# Patient Record
Sex: Male | Born: 1935 | Race: White | Hispanic: No | Marital: Married | Smoking: Former smoker
Health system: Southern US, Community
[De-identification: ages and names within clinical notes are randomized; demographics above are authoritative.]

## PROBLEM LIST (undated history)

## (undated) DIAGNOSIS — C801 Malignant (primary) neoplasm, unspecified: Secondary | ICD-10-CM

## (undated) DIAGNOSIS — F32A Depression, unspecified: Secondary | ICD-10-CM

## (undated) DIAGNOSIS — H269 Unspecified cataract: Secondary | ICD-10-CM

## (undated) DIAGNOSIS — F419 Anxiety disorder, unspecified: Secondary | ICD-10-CM

## (undated) HISTORY — PX: TONSILLECTOMY: SUR1361

## (undated) HISTORY — DX: Unspecified cataract: H26.9

## (undated) HISTORY — PX: OTHER SURGICAL HISTORY: SHX169

## (undated) HISTORY — DX: Malignant (primary) neoplasm, unspecified: C80.1

## (undated) HISTORY — DX: Anxiety disorder, unspecified: F41.9

## (undated) HISTORY — DX: Depression, unspecified: F32.A

---

## 2004-09-06 ENCOUNTER — Ambulatory Visit: Payer: Self-pay | Admitting: Urology

## 2004-12-14 ENCOUNTER — Emergency Department: Payer: Self-pay | Admitting: Emergency Medicine

## 2004-12-26 ENCOUNTER — Ambulatory Visit: Payer: Self-pay | Admitting: Urology

## 2006-09-18 ENCOUNTER — Ambulatory Visit: Payer: Self-pay | Admitting: Ophthalmology

## 2007-10-16 ENCOUNTER — Ambulatory Visit: Payer: Self-pay | Admitting: Internal Medicine

## 2007-10-25 ENCOUNTER — Ambulatory Visit: Payer: Self-pay | Admitting: Urology

## 2007-11-03 ENCOUNTER — Ambulatory Visit: Payer: Self-pay | Admitting: Urology

## 2007-11-03 ENCOUNTER — Other Ambulatory Visit: Payer: Self-pay

## 2007-11-04 ENCOUNTER — Ambulatory Visit: Payer: Self-pay | Admitting: Urology

## 2007-11-07 ENCOUNTER — Ambulatory Visit: Payer: Self-pay | Admitting: Cardiology

## 2008-01-13 ENCOUNTER — Ambulatory Visit: Payer: Self-pay | Admitting: Urology

## 2008-03-01 ENCOUNTER — Ambulatory Visit: Payer: Self-pay | Admitting: Urology

## 2010-02-18 ENCOUNTER — Ambulatory Visit: Payer: Self-pay | Admitting: Internal Medicine

## 2010-05-21 ENCOUNTER — Ambulatory Visit: Payer: Self-pay | Admitting: Unknown Physician Specialty

## 2013-04-13 ENCOUNTER — Emergency Department: Payer: Self-pay | Admitting: Emergency Medicine

## 2013-04-13 LAB — URINALYSIS, COMPLETE
Bacteria: NONE SEEN
Glucose,UR: NEGATIVE mg/dL (ref 0–75)
Leukocyte Esterase: NEGATIVE
Ph: 5 (ref 4.5–8.0)
RBC,UR: 191 /HPF (ref 0–5)
Specific Gravity: 1.018 (ref 1.003–1.030)
Squamous Epithelial: <1

## 2013-04-13 LAB — CBC
HCT: 43.5 % (ref 40.0–52.0)
HGB: 15.1 g/dL (ref 13.0–18.0)
MCH: 31.4 pg (ref 26.0–34.0)
MCHC: 34.7 g/dL (ref 32.0–36.0)
RDW: 14 % (ref 11.5–14.5)
WBC: 7.5 10*3/uL (ref 3.8–10.6)

## 2013-04-13 LAB — BASIC METABOLIC PANEL
Anion Gap: 7 (ref 7–16)
Calcium, Total: 8.9 mg/dL (ref 8.5–10.1)
EGFR (Non-African Amer.): 40 — ABNORMAL LOW
Glucose: 97 mg/dL (ref 65–99)
Osmolality: 282 (ref 275–301)
Potassium: 3.8 mmol/L (ref 3.5–5.1)
Sodium: 141 mmol/L (ref 136–145)

## 2013-10-12 DIAGNOSIS — C44319 Basal cell carcinoma of skin of other parts of face: Secondary | ICD-10-CM | POA: Diagnosis not present

## 2013-10-12 DIAGNOSIS — D485 Neoplasm of uncertain behavior of skin: Secondary | ICD-10-CM | POA: Diagnosis not present

## 2013-10-12 DIAGNOSIS — Z85828 Personal history of other malignant neoplasm of skin: Secondary | ICD-10-CM | POA: Diagnosis not present

## 2013-11-09 DIAGNOSIS — L819 Disorder of pigmentation, unspecified: Secondary | ICD-10-CM | POA: Diagnosis not present

## 2013-11-09 DIAGNOSIS — L821 Other seborrheic keratosis: Secondary | ICD-10-CM | POA: Diagnosis not present

## 2013-11-09 DIAGNOSIS — C44319 Basal cell carcinoma of skin of other parts of face: Secondary | ICD-10-CM | POA: Diagnosis not present

## 2013-12-21 DIAGNOSIS — C44319 Basal cell carcinoma of skin of other parts of face: Secondary | ICD-10-CM | POA: Diagnosis not present

## 2014-02-17 DIAGNOSIS — H43819 Vitreous degeneration, unspecified eye: Secondary | ICD-10-CM | POA: Diagnosis not present

## 2014-03-16 DIAGNOSIS — N183 Chronic kidney disease, stage 3 unspecified: Secondary | ICD-10-CM | POA: Insufficient documentation

## 2014-03-17 DIAGNOSIS — E538 Deficiency of other specified B group vitamins: Secondary | ICD-10-CM | POA: Diagnosis not present

## 2014-03-17 DIAGNOSIS — N183 Chronic kidney disease, stage 3 unspecified: Secondary | ICD-10-CM | POA: Diagnosis not present

## 2014-03-17 DIAGNOSIS — D696 Thrombocytopenia, unspecified: Secondary | ICD-10-CM | POA: Diagnosis not present

## 2014-03-17 DIAGNOSIS — K219 Gastro-esophageal reflux disease without esophagitis: Secondary | ICD-10-CM | POA: Diagnosis not present

## 2014-03-17 DIAGNOSIS — E7800 Pure hypercholesterolemia, unspecified: Secondary | ICD-10-CM | POA: Diagnosis not present

## 2014-03-17 DIAGNOSIS — R6889 Other general symptoms and signs: Secondary | ICD-10-CM | POA: Diagnosis not present

## 2015-03-19 DIAGNOSIS — N183 Chronic kidney disease, stage 3 (moderate): Secondary | ICD-10-CM | POA: Diagnosis not present

## 2015-03-19 DIAGNOSIS — E780 Pure hypercholesterolemia: Secondary | ICD-10-CM | POA: Diagnosis not present

## 2015-03-19 DIAGNOSIS — D696 Thrombocytopenia, unspecified: Secondary | ICD-10-CM | POA: Diagnosis not present

## 2015-03-19 DIAGNOSIS — E538 Deficiency of other specified B group vitamins: Secondary | ICD-10-CM | POA: Diagnosis not present

## 2015-07-24 DIAGNOSIS — H43393 Other vitreous opacities, bilateral: Secondary | ICD-10-CM | POA: Diagnosis not present

## 2016-01-08 ENCOUNTER — Ambulatory Visit
Admission: EM | Admit: 2016-01-08 | Discharge: 2016-01-08 | Disposition: A | Discharge status: Home / Self Care | Payer: Medicare Other | Attending: Family Medicine | Admitting: Family Medicine

## 2016-01-08 DIAGNOSIS — L259 Unspecified contact dermatitis, unspecified cause: Secondary | ICD-10-CM | POA: Insufficient documentation

## 2016-01-08 DIAGNOSIS — R109 Unspecified abdominal pain: Secondary | ICD-10-CM | POA: Diagnosis not present

## 2016-01-08 DIAGNOSIS — E860 Dehydration: Secondary | ICD-10-CM | POA: Diagnosis not present

## 2016-01-08 DIAGNOSIS — E876 Hypokalemia: Secondary | ICD-10-CM | POA: Insufficient documentation

## 2016-01-08 DIAGNOSIS — R300 Dysuria: Secondary | ICD-10-CM | POA: Insufficient documentation

## 2016-01-08 DIAGNOSIS — K439 Ventral hernia without obstruction or gangrene: Secondary | ICD-10-CM | POA: Diagnosis not present

## 2016-01-08 DIAGNOSIS — R1010 Upper abdominal pain, unspecified: Secondary | ICD-10-CM

## 2016-01-08 LAB — COMPREHENSIVE METABOLIC PANEL
ALT: 11 U/L — AB (ref 17–63)
AST: 19 U/L (ref 15–41)
Albumin: 4 g/dL (ref 3.5–5.0)
Alkaline Phosphatase: 62 U/L (ref 38–126)
Anion gap: 10 (ref 5–15)
BILIRUBIN TOTAL: 1.4 mg/dL — AB (ref 0.3–1.2)
BUN: 12 mg/dL (ref 6–20)
CO2: 23 mmol/L (ref 22–32)
CREATININE: 1.3 mg/dL — AB (ref 0.61–1.24)
Calcium: 8.5 mg/dL — ABNORMAL LOW (ref 8.9–10.3)
Chloride: 106 mmol/L (ref 101–111)
GFR calc Af Amer: 57 mL/min — ABNORMAL LOW (ref >60)
GFR, EST NON AFRICAN AMERICAN: 49 mL/min — AB (ref >60)
Glucose, Bld: 125 mg/dL — ABNORMAL HIGH (ref 65–99)
Potassium: 3.2 mmol/L — ABNORMAL LOW (ref 3.5–5.1)
Sodium: 139 mmol/L (ref 135–145)
TOTAL PROTEIN: 6.7 g/dL (ref 6.5–8.1)

## 2016-01-08 LAB — CBC WITH DIFFERENTIAL/PLATELET
BASOS ABS: 0.1 10*3/uL (ref 0–0.1)
Basophils Relative: 1 %
Eosinophils Absolute: 0.3 10*3/uL (ref 0–0.7)
Eosinophils Relative: 5 %
HEMATOCRIT: 42.5 % (ref 40.0–52.0)
Hemoglobin: 14.1 g/dL (ref 13.0–18.0)
LYMPHS ABS: 1.2 10*3/uL (ref 1.0–3.6)
LYMPHS PCT: 18 %
MCH: 29.5 pg (ref 26.0–34.0)
MCHC: 33.1 g/dL (ref 32.0–36.0)
MCV: 89.2 fL (ref 80.0–100.0)
MONO ABS: 0.5 10*3/uL (ref 0.2–1.0)
Monocytes Relative: 8 %
NEUTROS ABS: 4.3 10*3/uL (ref 1.4–6.5)
Neutrophils Relative %: 68 %
Platelets: 176 10*3/uL (ref 150–440)
RBC: 4.76 MIL/uL (ref 4.40–5.90)
RDW: 14.3 % (ref 11.5–14.5)
WBC: 6.4 10*3/uL (ref 3.8–10.6)

## 2016-01-08 LAB — URINALYSIS COMPLETE WITH MICROSCOPIC (ARMC ONLY)

## 2016-01-08 MED ORDER — POTASSIUM CHLORIDE CRYS ER 20 MEQ PO TBCR
40.0000 meq | EXTENDED_RELEASE_TABLET | Freq: Once | ORAL | Status: AC
  Administered 2016-01-08: 40 meq via ORAL

## 2016-01-08 MED ORDER — SODIUM CHLORIDE 0.9 % IV BOLUS (SEPSIS)
1000.0000 mL | Freq: Once | INTRAVENOUS | Status: AC
  Administered 2016-01-08: 1000 mL via INTRAVENOUS

## 2016-01-08 NOTE — ED Notes (Signed)
Patient says he started urinating frequently today and has had left back pain. History of kidney stones, last one being several years ago.

## 2016-01-08 NOTE — ED Provider Notes (Signed)
CSN: [id number]     Arrival date & time 01/08/16  1735 History   First MD Initiated Contact with Patient 01/08/16 1808     Chief Complaint  Patient presents with  . Flank Pain    Left   (Consider location/radiation/quality/duration/timing/severity/associated sxs/prior Treatment) HPI Comments: Married caucasian male here for evaluation of left flank pain, unable to pee but feels like he has to go only able to get a couple drops out since day before yesterday.  Last normal void/stream/volume 2 days ago.  Air conditioning broke in their house, have been rearranging furniture/heavy lifting thought muscle aches from lifting furniture.  This pain doesn't feel like his previous kidney stones  Wife gave him a urostat about 2 1/2 hours ago pain 9/10 now left flank nausea pain first started flank this am 4/10 when he woke up rash also new right arm PMHx Chronic Kidney disease, kidney stones with lithotripsy  Patient is a 82 y.o. male presenting with flank pain. The history is provided by the patient and the spouse.  Flank Pain This is a new problem. The current episode started 6 to 12 hours ago. The problem occurs constantly. The problem has been gradually worsening. Pertinent negatives include no chest pain, no abdominal pain, no headaches and no shortness of breath. The symptoms are aggravated by walking, bending, exertion and twisting. Nothing relieves the symptoms. He has tried rest, food and water (urostat) for the symptoms. The treatment provided no relief.    History reviewed. No pertinent past medical history. Past Surgical History  Procedure Laterality Date  . Tonsillectomy     History reviewed. No pertinent family history. Social History  Substance Use Topics  . Smoking status: Former Research scientist (life sciences)  . Smokeless tobacco: None  . Alcohol Use: No    Review of Systems  Constitutional: Negative for fever, chills, diaphoresis, activity change, appetite change and fatigue.  HENT: Positive for  postnasal drip. Negative for congestion, dental problem, drooling, ear discharge, ear pain, facial swelling, hearing loss, mouth sores, nosebleeds, rhinorrhea, sinus pressure, sneezing, sore throat, tinnitus, trouble swallowing and voice change.   Eyes: Positive for redness. Negative for photophobia, pain, discharge and itching.  Respiratory: Negative for cough, choking, chest tightness, shortness of breath and wheezing.   Cardiovascular: Negative for chest pain, palpitations and leg swelling.  Gastrointestinal: Positive for nausea. Negative for vomiting, abdominal pain, diarrhea, constipation, blood in stool, abdominal distention and anal bleeding.  Endocrine: Negative for cold intolerance and heat intolerance.  Genitourinary: Positive for frequency, flank pain, decreased urine volume and difficulty urinating. Negative for dysuria, urgency, hematuria, discharge, penile swelling, scrotal swelling, enuresis, genital sores, penile pain and testicular pain.  Musculoskeletal: Positive for myalgias and back pain. Negative for joint swelling, arthralgias, gait problem, neck pain and neck stiffness.  Skin: Positive for rash. Negative for color change, pallor and wound.  Allergic/Immunologic: Positive for environmental allergies. Negative for food allergies.  Neurological: Negative for dizziness, tremors, seizures, syncope, facial asymmetry, speech difficulty, weakness, light-headedness, numbness and headaches.  Hematological: Negative for adenopathy. Does not bruise/bleed easily.  Psychiatric/Behavioral: Negative for confusion, sleep disturbance and agitation. The patient is not nervous/anxious.     Allergies  Sulfa antibiotics  Home Medications   Prior to Admission medications   Not on File   Meds Ordered and Administered this Visit   Medications  sodium chloride 0.9 % bolus 1,000 mL (1,000 mLs Intravenous Given 01/08/16 1913)  potassium chloride SA (K-DUR,KLOR-CON) CR tablet 40 mEq (40 mEq Oral  Given 01/08/16  1911)    BP 165/85 mmHg  Pulse 102  Temp(Src) 98 F (36.7 C) (Oral)  Resp 16  Ht 5\' 7"  (1.702 m)  Wt 138 lb (62.596 kg)  BMI 21.61 kg/m2  SpO2 97% No data found.   Physical Exam  Constitutional: He is oriented to person, place, and time. Vital signs are normal. He appears well-developed and well-nourished. He is active and cooperative.  Non-toxic appearance. He does not have a sickly appearance. He does not appear ill. No distress.  HENT:  Head: Normocephalic and atraumatic.  Right Ear: Hearing, external ear and ear canal normal. A middle ear effusion is present.  Left Ear: Hearing, external ear and ear canal normal. A middle ear effusion is present.  Nose: Mucosal edema and rhinorrhea present. No nose lacerations, sinus tenderness, nasal deformity, septal deviation or nasal septal hematoma. No epistaxis.  No foreign bodies. Right sinus exhibits no maxillary sinus tenderness and no frontal sinus tenderness. Left sinus exhibits no maxillary sinus tenderness and no frontal sinus tenderness.  Mouth/Throat: Uvula is midline and mucous membranes are normal. Mucous membranes are not pale, not dry and not cyanotic. He does not have dentures. No oral lesions. No trismus in the jaw. Normal dentition. No dental abscesses, uvula swelling, lacerations or dental caries. Posterior oropharyngeal edema and posterior oropharyngeal erythema present. No oropharyngeal exudate or tonsillar abscesses.  Cobblestoning posterior pharynx; bilateral TMs with air fluid level clear; bilateral nasal turbinates with edema/erythema clear discharge; bilateral allergic shiners/edema nonpitting 1+/4   Eyes: EOM and lids are normal. Pupils are equal, round, and reactive to light. Right eye exhibits no chemosis, no discharge, no exudate and no hordeolum. No foreign body present in the right eye. Left eye exhibits no chemosis, no discharge, no exudate and no hordeolum. No foreign body present in the left eye. Right  conjunctiva is injected. Right conjunctiva has no hemorrhage. Left conjunctiva is injected. Left conjunctiva has no hemorrhage. No scleral icterus. Right eye exhibits normal extraocular motion and no nystagmus. Left eye exhibits normal extraocular motion and no nystagmus. Right pupil is round and reactive. Left pupil is round and reactive. Pupils are equal.  1+/4 bilateral bulbar and eyelid conjunctiva injection  Neck: Trachea normal and normal range of motion. Neck supple. No tracheal tenderness, no spinous process tenderness and no muscular tenderness present. No rigidity. No tracheal deviation, no edema, no erythema and normal range of motion present. No thyroid mass and no thyromegaly present.  Cardiovascular: Normal rate, regular rhythm, S1 normal, S2 normal, normal heart sounds and intact distal pulses.  PMI is not displaced.  Exam reveals no gallop and no friction rub.   No murmur heard. Pulses:      Radial pulses are 2+ on the right side, and 2+ on the left side.  Pulmonary/Chest: Effort normal and breath sounds normal. No stridor. No respiratory distress. He has no decreased breath sounds. He has no wheezes. He has no rhonchi. He has no rales.  Abdominal: Soft. He exhibits no shifting dullness, no distension, no pulsatile liver, no fluid wave, no abdominal bruit, no ascites, no pulsatile midline mass and no mass. Bowel sounds are decreased. There is no hepatosplenomegaly. There is no tenderness. There is no rigidity, no rebound, no guarding, no CVA tenderness, no tenderness at McBurney's point and negative Murphy's sign. A hernia is present. Hernia confirmed positive in the ventral area.    tympanny to percussion x 3 quads; dull to percussion right upper quad; hypoactive bowel sounds x 4 quads  Musculoskeletal: Normal range of motion. He exhibits no edema or tenderness.       Right shoulder: Normal.       Left shoulder: Normal.       Right elbow: Normal.      Left elbow: Normal.       Right  hip: Normal.       Left hip: Normal.       Right knee: Normal.       Left knee: Normal.       Cervical back: Normal.       Thoracic back: He exhibits pain. He exhibits normal range of motion, no tenderness, no bony tenderness, no swelling, no edema, no deformity, no laceration, no spasm and normal pulse.       Lumbar back: He exhibits pain. He exhibits normal range of motion, no tenderness, no bony tenderness, no swelling, no edema, no deformity, no laceration, no spasm and normal pulse.       Back:       Right hand: Normal.       Left hand: Normal.  Lymphadenopathy:       Head (right side): No submental, no submandibular, no tonsillar, no preauricular, no posterior auricular and no occipital adenopathy present.       Head (left side): No submental, no submandibular, no tonsillar, no preauricular, no posterior auricular and no occipital adenopathy present.    He has no cervical adenopathy.       Right cervical: No superficial cervical, no deep cervical and no posterior cervical adenopathy present.      Left cervical: No superficial cervical, no deep cervical and no posterior cervical adenopathy present.  Neurological: He is alert and oriented to person, place, and time. He displays no atrophy and no tremor. No cranial nerve deficit or sensory deficit. He exhibits normal muscle tone. He displays no seizure activity. Coordination and gait normal. GCS eye subscore is 4. GCS verbal subscore is 5. GCS motor subscore is 6.  Bilateral hand grasp equal 5/5 AROM x 4 5/5 strength bilaterally  Skin: Skin is warm, dry and intact. Rash noted. No abrasion, no bruising, no burn, no ecchymosis, no laceration, no lesion, no petechiae and no purpura noted. Rash is macular, papular and maculopapular. Rash is not nodular, not pustular, not vesicular and not urticarial. He is not diaphoretic. No cyanosis or erythema. No pallor. Nails show no clubbing.     Psychiatric: He has a normal mood and affect. His speech is  normal and behavior is normal. Judgment and thought content normal. Cognition and memory are normal.  Nursing note and vitals reviewed.   ED Course  Procedures (including critical care time)  Labs Review Labs Reviewed  URINALYSIS COMPLETEWITH MICROSCOPIC ([facility] ONLY) - Abnormal; Notable for the following:    Color, Urine ORANGE (*)    Glucose, UA   (*)    Value: TEST NOT REPORTED DUE TO COLOR INTERFERENCE OF URINE PIGMENT   Bilirubin Urine   (*)    Value: TEST NOT REPORTED DUE TO COLOR INTERFERENCE OF URINE PIGMENT   Ketones, ur   (*)    Value: TEST NOT REPORTED DUE TO COLOR INTERFERENCE OF URINE PIGMENT   Hgb urine dipstick   (*)    Value: TEST NOT REPORTED DUE TO COLOR INTERFERENCE OF URINE PIGMENT   Protein, ur   (*)    Value: TEST NOT REPORTED DUE TO COLOR INTERFERENCE OF URINE PIGMENT   Nitrite   (*)    Value:  TEST NOT REPORTED DUE TO COLOR INTERFERENCE OF URINE PIGMENT   Leukocytes, UA   (*)    Value: TEST NOT REPORTED DUE TO COLOR INTERFERENCE OF URINE PIGMENT   Bacteria, UA RARE (*)    Squamous Epithelial / LPF 0-5 (*)    All other components within normal limits  COMPREHENSIVE METABOLIC PANEL - Abnormal; Notable for the following:    Potassium 3.2 (*)    Glucose, Bld 125 (*)    Creatinine, Ser 1.30 (*)    Calcium 8.5 (*)    ALT 11 (*)    Total Bilirubin 1.4 (*)    GFR calc non Af Amer 49 (*)    GFR calc Af Amer 57 (*)    All other components within normal limits  URINE CULTURE  CBC WITH DIFFERENTIAL/PLATELET    Imaging Review No results found.  1830 CMP and CBC and urinalysis pending  1900 discussed lab results with patient and spouse hypocalcemia, hypokalemia, kidney function stable urine sample inadequate to perform all analysis will try to obtain another sample after fluid bolus normal saline 1 liter as patient dehydrated.  Patient and spouse verbalized understanding information/instructions, agreed with plan of care and had no further questions at this  time.  Normal saline bolus and IV placement initiated at 1913 by RN Renne Crigler Medications  sodium chloride 0.9 % bolus 1,000 mL (1,000 mLs Intravenous Given 01/08/16 1913)  potassium chloride SA (K-DUR,KLOR-CON) CR tablet 40 mEq (40 mEq Oral Given 01/08/16 1911)  by RN Renne Crigler  2000 NS bolus continues without pain/swelling/erythema approximately 500cc completed.  2023 1 liter NS bolus completed; patient ambulated to bathroom voided without difficulty urine red colored 475ml urine culture ordered.  Patient and spouse ready to go home.  If unable to void or worsening pain to go to ER tonight and follow up with [facility] and/or urology this week for re-evaluation.  Do not take any more urostat.  Patient and spouse verbalized understanding information/instructions, agreed with plan of care and had no further questions at this time.  Repeat vitals 2033 149/83 98% sp02 rr 16 HR 60  Discussed potassium rich foods with patient and spouse.  Discussed no mowing the lawn tomorrow stay in air conditioning and hydrate.  Pain has resolved 0/10 currently per patient.  Patient and spouse verbalized understanding information/instructions agreed with plan of care and had no further questions at this time.  Filed Vitals:   01/08/16 1802  BP: 165/85  Pulse: 102  Temp: 98 F (36.7 C)  TempSrc: Oral  Resp: 16  Height: 5\' 7"  (1.702 m)  Weight: 138 lb (62.596 kg)  SpO2: 97%   MDM   1. Ventral hernia without obstruction or gangrene   2. Dysuria   3. Acute flank pain   4. Hypokalemia   5. Hypocalcemia   6. Dehydration, mild   7. Dermatitis, contact     Continue to monitor ventral hernia if not painful and reducible/not impairing elimination or diet.  Discussed if not reducible, worsening pain/enlarging size to contact PCM or go to ER after hours for evaluation and treatment.  Exitcare handout on ventral hernia given to patient.  Patient and spouse verbalized understanding of information/instructions/agreed  with plan of care and had no further questions at this time.  Patient is to sip fluids to keep urine pale yellow--gatorade/powerade alternated with chocolate milk and water tonight/tomorrow.  Hydrate, avoid dehydration.  Avoid holding urine void on frequent basis every 4 to 6 hours.  If unable  to void every 8 hours follow up for re-evaluation with PCM, urgent care or ER.   Call or return to clinic as needed if these symptoms worsen or fail to improve as anticipated.  Exitcare handout on kidney stones, UTI given to patient  Will call with urine culture results typically 48 hours if patient has not been notified to call and speak with RN 23 Jun for results as I will be on vacation 23 Jun and if slow growing bacteria can take 72 hours for results.   Patient and spouse verbalized agreement and understanding of treatment plan and had no further questions at this time. P2:  Hydrate and cranberry juice  Mild dehydration due to increased loss/exercise/airconditioning out at home.  Discussed with patient and spouse to push po fluids (water, broth, gatorade, pedialyte, ginger ale) to ensure urine pale yellow clear, mucous membranes wet/pink no white coating on tongue.  Prolonged dehydration could worsen kidney function and cause acute renal insufficiency.  Follow up if lethargy, unable to urinate every 8 hours, brown/cola colored urine.  Exitcare handout on dehydration given to patient.  Patient and spouse verbalized understanding of information/instructions, agreed with plan of care and had no further questions at this time.  Discussed with patient diarrhea, vomiting, kidney disease, medications,low dietary intake can cause hypokalemia.  Increased sweating and decreased po intake this week.  Potassium chloride 57meq po today.  Follow up with [facility] for repeat testing.  Return to clinic or ER for re-evaluation if muscle cramps, weakness, nausea, vomiting, diarrhea, fatigue, syncope.  Exitcare handout on hypokalemia and  hypocalcemia given to patient.  [email or web address] handout on potassium rich foods given to patient.  Patient and spouse verbalized understanding of information/instructions, agreed with plan of care and had no further questions at this time.  Symptomatic therapy suggested.  Warm to cool water soaks and/or oatmeal baths.  Call or return to clinic as needed if these symptoms worsen or fail to improve as anticipated. Apply emollient to affected area do not scratch.  Spouse and  Patient verbalized agreement and understanding of treatment plan.   P2:  Avoidance and hand washing.    Olen Cordial, NP 01/08/16 2044  Olen Cordial, NP 01/08/16 [phone]

## 2016-01-08 NOTE — Discharge Instructions (Signed)
Ventral Hernia A ventral hernia (also called an incisional hernia) is a hernia that occurs at the site of a previous surgical cut (incision) in the abdomen. The abdominal wall spans from your lower chest down to your pelvis. If the abdominal wall is weakened from a surgical incision, a hernia can occur. A hernia is a bulge of bowel or muscle tissue pushing out on the weakened part of the abdominal wall. Ventral hernias can get bigger from straining or lifting. Obese and older people are at higher risk for a ventral hernia. People who develop infections after surgery or require repeat incisions at the same site on the abdomen are also at increased risk. CAUSES  A ventral hernia occurs because of weakness in the abdominal wall at an incision site.  SYMPTOMS  Common symptoms include:  A visible bulge or lump on the abdominal wall.  Pain or tenderness around the lump.  Increased discomfort if you cough or make a sudden movement. If the hernia has blocked part of the intestine, a serious complication can occur (incarcerated or strangulated hernia). This can become a problem that requires emergency surgery because the blood flow to the blocked intestine may be cut off. Symptoms may include:  Feeling sick to your stomach (nauseous).  Throwing up (vomiting).  Stomach swelling (distention) or bloating.  Fever.  Rapid heartbeat. DIAGNOSIS  Your health care provider will take a medical history and perform a physical exam. Various tests may be ordered, such as:  Blood tests.  Urine tests.  Ultrasonography.  X-rays.  Computed tomography (CT). TREATMENT  Watchful waiting may be all that is needed for a smaller hernia that does not cause symptoms. Your health care provider may recommend the use of a supportive belt (truss) that helps to keep the abdominal wall intact. For larger hernias or those that cause pain, surgery to repair the hernia is usually recommended. If a hernia becomes  strangulated, emergency surgery needs to be done right away. HOME CARE INSTRUCTIONS  Avoid putting pressure or strain on the abdominal area.  Avoid heavy lifting.  Use good body positioning for physical tasks. Ask your health care provider about proper body positioning.  Use a supportive belt as directed by your health care provider.  Maintain a healthy weight.  Eat foods that are high in fiber, such as whole grains, fruits, and vegetables. Fiber helps prevent difficult bowel movements (constipation).  Drink enough fluids to keep your urine clear or pale yellow.  Follow up with your health care provider as directed. SEEK MEDICAL CARE IF:   Your hernia seems to be getting larger or more painful. SEEK IMMEDIATE MEDICAL CARE IF:   You have abdominal pain that is sudden and sharp.  Your pain becomes severe.  You have repeated vomiting.  You are sweating a lot.  You notice a rapid heartbeat.  You develop a fever. MAKE SURE YOU:   Understand these instructions.  Will watch your condition.  Will get help right away if you are not doing well or get worse.   This information is not intended to replace advice given to you by your health care provider. Make sure you discuss any questions you have with your health care provider.   Document Released: 06/23/2012 Document Revised: 07/28/2014 Document Reviewed: 06/23/2012 Elsevier Interactive Patient Education 2016 [employer] Hypocalcemia, Adult Hypocalcemia is low blood calcium. Calcium is important for cells to function in the body. Low blood calcium can cause a variety of symptoms and problems. CAUSES  Low levels of a body protein called albumin.  Problems with the parathyroid glands or surgical removal of the parathyroid glands. The parathyroid glands maintain the body's level of calcium.  Decreased production or improper use of parathyroid hormone.  Lack (deficiency) of vitamin D or magnesium or both.  Intestinal  problems that interfere with nutrient absorption.  Alcoholism.  Kidney problems.  Inflammation of the pancreas (pancreatitis).  Certain medicines.  Severe infections (sepsis).  Infiltrative diseases. With these diseases the parathyroid glands are filled with cells or substances that are not normally present. Examples include:  Sarcoidosis.  Hemachromatosis.  Breakdown of large amounts of muscle fiber.  High levels of phosphate in the body.  Cancer.  Massive blood transfusions which usually occur with severe trauma. SYMPTOMS   Numbness and tingling in the fingers, toes, or around the mouth.  Muscle aches or cramps, especially in the legs, feet, and back.  Muscle twitches.  Shortness of breath or wheezing.  Difficulty swallowing.  Changes in the sound of the voice.  General weakness.  Fainting.  Fast heart beats (palpitations).  Chest pain.  Irritability.  Difficulty thinking.  Memory problems or confusion.  Severe fatigue.  Changes in personality.  Depression and anxiety.  Shaking uncontrollably (seizures).  Coarse, brittle hair and nails.  Dry skin or lasting (chronic) skin diseases (psoriasis, eczema, or dermatitis).  Clouding of the eye lens (cataracts).  Abdominal cramping or pain. DIAGNOSIS  Hypocalcemia is usually diagnosed through blood tests that reveal a low level of blood calcium. Other tests, such as a recording of the electrical activity of the heart (electrocardiogram, EKG), may be performed in order to diagnose the underlying cause of the condition. TREATMENT  Treatment for hypocalcemia includes giving calcium supplements. These can be given by mouth or by intravenous (IV) access tube, depending on the severity of the symptoms and deficiency. Other minerals (electrolytes), such as magnesium, may also be given. HOME CARE INSTRUCTIONS   Meet with a dietitian to make sure you are eating the most healthful diet possible, or follow  diet instructions as directed by your caregiver.  Follow up with your caregiver as directed. SEEK IMMEDIATE MEDICAL CARE IF:   You develop chest pain.  You develop persistent rapid or irregular heartbeats.  You have difficulty breathing.  You faint.  You develop increased fatigue.  You have new swelling in the feet, ankles, or legs.  You develop increased muscle twitching.  You start to have seizures.  You develop confusion.  You develop mood, memory, or personality changes. MAKE SURE YOU:   Understand these instructions.  Will watch your condition.  Will get help right away if you are not doing well or get worse.   This information is not intended to replace advice given to you by your health care provider. Make sure you discuss any questions you have with your health care provider.   Document Released: 12/25/2009 Document Revised: 09/29/2011 Document Reviewed: 11/22/2014 Elsevier Interactive Patient Education 2016 [employer]. Hypokalemia Hypokalemia means that the amount of potassium in the blood is lower than normal.Potassium is a chemical, called an electrolyte, that helps regulate the amount of fluid in the body. It also stimulates muscle contraction and helps nerves function properly.Most of the body's potassium is inside of cells, and only a very small amount is in the blood. Because the amount in the blood is so small, minor changes can be life-threatening. CAUSES  Antibiotics.  Diarrhea or vomiting.  Using laxatives too much, which can cause diarrhea.  Chronic kidney disease.  Water pills (diuretics).  Eating disorders (bulimia).  Low magnesium level.  Sweating a lot. SIGNS AND SYMPTOMS  Weakness.  Constipation.  Fatigue.  Muscle cramps.  Mental confusion.  Skipped heartbeats or irregular heartbeat (palpitations).  Tingling or numbness. DIAGNOSIS  Your health care provider can diagnose hypokalemia with blood tests. In addition to  checking your potassium level, your health care provider may also check other lab tests. TREATMENT Hypokalemia can be treated with potassium supplements taken by mouth or adjustments in your current medicines. If your potassium level is very low, you may need to get potassium through a vein (IV) and be monitored in the hospital. A diet high in potassium is also helpful. Foods high in potassium are:  Nuts, such as peanuts and pistachios.  Seeds, such as sunflower seeds and pumpkin seeds.  Peas, lentils, and lima beans.  Whole grain and bran cereals and breads.  Fresh fruit and vegetables, such as apricots, avocado, bananas, cantaloupe, kiwi, oranges, tomatoes, asparagus, and potatoes.  Orange and tomato juices.  Red meats.  Fruit yogurt. HOME CARE INSTRUCTIONS  Take all medicines as prescribed by your health care provider.  Maintain a healthy diet by including nutritious food, such as fruits, vegetables, nuts, whole grains, and lean meats.  If you are taking a laxative, be sure to follow the directions on the label. SEEK MEDICAL CARE IF:  Your weakness gets worse.  You feel your heart pounding or racing.  You are vomiting or having diarrhea.  You are diabetic and having trouble keeping your blood glucose in the normal range. SEEK IMMEDIATE MEDICAL CARE IF:  You have chest pain, shortness of breath, or dizziness.  You are vomiting or having diarrhea for more than 2 days.  You faint. MAKE SURE YOU:   Understand these instructions.  Will watch your condition.  Will get help right away if you are not doing well or get worse.   This information is not intended to replace advice given to you by your health care provider. Make sure you discuss any questions you have with your health care provider.   Document Released: 07/07/2005 Document Revised: 07/28/2014 Document Reviewed: 01/07/2013 Elsevier Interactive Patient Education 2016 [employer]. Kidney Stones Kidney  stones (urolithiasis) are deposits that form inside your kidneys. The intense pain is caused by the stone moving through the urinary tract. When the stone moves, the ureter goes into spasm around the stone. The stone is usually passed in the urine.  CAUSES   A disorder that makes certain neck glands produce too much parathyroid hormone (primary hyperparathyroidism).  A buildup of uric acid crystals, similar to gout in your joints.  Narrowing (stricture) of the ureter.  A kidney obstruction present at birth (congenital obstruction).  Previous surgery on the kidney or ureters.  Numerous kidney infections. SYMPTOMS   Feeling sick to your stomach (nauseous).  Throwing up (vomiting).  Blood in the urine (hematuria).  Pain that usually spreads (radiates) to the groin.  Frequency or urgency of urination. DIAGNOSIS   Taking a history and physical exam.  Blood or urine tests.  CT scan.  Occasionally, an examination of the inside of the urinary bladder (cystoscopy) is performed. TREATMENT   Observation.  Increasing your fluid intake.  Extracorporeal shock wave lithotripsy--This is a noninvasive procedure that uses shock waves to break up kidney stones.  Surgery may be needed if you have severe pain or persistent obstruction. There are various surgical procedures. Most of the procedures are  performed with the use of small instruments. Only small incisions are needed to accommodate these instruments, so recovery time is minimized. The size, location, and chemical composition are all important variables that will determine the proper choice of action for you. Talk to your health care provider to better understand your situation so that you will minimize the risk of injury to yourself and your kidney.  HOME CARE INSTRUCTIONS   Drink enough water and fluids to keep your urine clear or pale yellow. This will help you to pass the stone or stone fragments.  Strain all urine through the  provided strainer. Keep all particulate matter and stones for your health care provider to see. The stone causing the pain may be as small as a grain of salt. It is very important to use the strainer each and every time you pass your urine. The collection of your stone will allow your health care provider to analyze it and verify that a stone has actually passed. The stone analysis will often identify what you can do to reduce the incidence of recurrences.  Only take over-the-counter or prescription medicines for pain, discomfort, or fever as directed by your health care provider.  Keep all follow-up visits as told by your health care provider. This is important.  Get follow-up X-rays if required. The absence of pain does not always mean that the stone has passed. It may have only stopped moving. If the urine remains completely obstructed, it can cause loss of kidney function or even complete destruction of the kidney. It is your responsibility to make sure X-rays and follow-ups are completed. Ultrasounds of the kidney can show blockages and the status of the kidney. Ultrasounds are not associated with any radiation and can be performed easily in a matter of minutes.  Make changes to your daily diet as told by your health care provider. You may be told to:  Limit the amount of salt that you eat.  Eat 5 or more servings of fruits and vegetables each day.  Limit the amount of meat, poultry, fish, and eggs that you eat.  Collect a 24-hour urine sample as told by your health care provider.You may need to collect another urine sample every 6-12 months. SEEK MEDICAL CARE IF:  You experience pain that is progressive and unresponsive to any pain medicine you have been prescribed. SEEK IMMEDIATE MEDICAL CARE IF:   Pain cannot be controlled with the prescribed medicine.  You have a fever or shaking chills.  The severity or intensity of pain increases over 18 hours and is not relieved by pain  medicine.  You develop a new onset of abdominal pain.  You feel faint or pass out.  You are unable to urinate.   This information is not intended to replace advice given to you by your health care provider. Make sure you discuss any questions you have with your health care provider.   Document Released: 07/07/2005 Document Revised: 03/28/2015 Document Reviewed: 12/08/2012 Elsevier Interactive Patient Education 2016 Else[employer] Inc. Dehydration, Adult Dehydration is a condition in which you do not have enough fluid or water in your body. It happens when you take in less fluid than you lose. Vital organs such as the kidneys, brain, and heart cannot function without a proper amount of fluids. Any loss of fluids from the body can cause dehydration.  Dehydration can range from mild to severe. This condition should be treated right away to help prevent it from becoming severe. CAUSES  This condition  may be caused by:  Vomiting.  Diarrhea.  Excessive sweating, such as when exercising in hot or humid weather.  Not drinking enough fluid during strenuous exercise or during an illness.  Excessive urine output.  Fever.  Certain medicines. RISK FACTORS This condition is more likely to develop in:  People who are taking certain medicines that cause the body to lose excess fluid (diuretics).   People who have a chronic illness, such as diabetes, that may increase urination.  Older adults.   People who live at high altitudes.   People who participate in endurance sports.  SYMPTOMS  Mild Dehydration  Thirst.  Dry lips.  Slightly dry mouth.  Dry, warm skin. Moderate Dehydration  Very dry mouth.   Muscle cramps.   Dark urine and decreased urine production.   Decreased tear production.   Headache.   Light-headedness, especially when you stand up from a sitting position.  Severe Dehydration  Changes in skin.   Cold and clammy skin.   Skin does not spring  back quickly when lightly pinched and released.   Changes in body fluids.   Extreme thirst.   No tears.   Not able to sweat when body temperature is high, such as in hot weather.   Minimal urine production.   Changes in vital signs.   Rapid, weak pulse (more than 100 beats per minute when you are sitting still).   Rapid breathing.   Low blood pressure.   Other changes.   Sunken eyes.   Cold hands and feet.   Confusion.  Lethargy and difficulty being awakened.  Fainting (syncope).   Short-term weight loss.   Unconsciousness. DIAGNOSIS  This condition may be diagnosed based on your symptoms. You may also have tests to determine how severe your dehydration is. These tests may include:   Urine tests.   Blood tests.  TREATMENT  Treatment for this condition depends on the severity. Mild or moderate dehydration can often be treated at home. Treatment should be started right away. Do not wait until dehydration becomes severe. Severe dehydration needs to be treated at the hospital. Treatment for Mild Dehydration  Drinking plenty of water to replace the fluid you have lost.   Replacing minerals in your blood (electrolytes) that you may have lost.  Treatment for Moderate Dehydration  Consuming oral rehydration solution (ORS). Treatment for Severe Dehydration  Receiving fluid through an IV tube.   Receiving electrolyte solution through a feeding tube that is passed through your nose and into your stomach (nasogastric tube or NG tube).  Correcting any abnormalities in electrolytes. HOME CARE INSTRUCTIONS   Drink enough fluid to keep your urine clear or pale yellow.   Drink water or fluid slowly by taking small sips. You can also try sucking on ice cubes.  Have food or beverages that contain electrolytes. Examples include bananas and sports drinks.  Take over-the-counter and prescription medicines only as told by your health care provider.    Prepare ORS according to the manufacturer's instructions. Take sips of ORS every 5 minutes until your urine returns to normal.  If you have vomiting or diarrhea, continue to try to drink water, ORS, or both.   If you have diarrhea, avoid:   Beverages that contain caffeine.   Fruit juice.   Milk.   Carbonated soft drinks.  Do not take salt tablets. This can lead to the condition of having too much sodium in your body (hypernatremia).  SEEK MEDICAL CARE IF:  You cannot eat  or drink without vomiting.  You have had moderate diarrhea during a period of more than 24 hours.  You have a fever. SEEK IMMEDIATE MEDICAL CARE IF:   You have extreme thirst.  You have severe diarrhea.  You have not urinated in 6-8 hours, or you have urinated only a small amount of very dark urine.  You have shriveled skin.  You are dizzy, confused, or both.   This information is not intended to replace advice given to you by your health care provider. Make sure you discuss any questions you have with your health care provider.   Document Released: 07/07/2005 Document Revised: 03/28/2015 Document Reviewed: 11/22/2014 Elsevier Interactive Patient Education 2016 [employer] Inc. Urinary Tract Infection Urinary tract infections (UTIs) can develop anywhere along your urinary tract. Your urinary tract is your body's drainage system for removing wastes and extra water. Your urinary tract includes two kidneys, two ureters, a bladder, and a urethra. Your kidneys are a pair of bean-shaped organs. Each kidney is about the size of your fist. They are located below your ribs, one on each side of your spine. CAUSES Infections are caused by microbes, which are microscopic organisms, including fungi, viruses, and bacteria. These organisms are so small that they can only be seen through a microscope. Bacteria are the microbes that most commonly cause UTIs. SYMPTOMS  Symptoms of UTIs may vary by age and gender of  the patient and by the location of the infection. Symptoms in young women typically include a frequent and intense urge to urinate and a painful, burning feeling in the bladder or urethra during urination. Older women and men are more likely to be tired, shaky, and weak and have muscle aches and abdominal pain. A fever may mean the infection is in your kidneys. Other symptoms of a kidney infection include pain in your back or sides below the ribs, nausea, and vomiting. DIAGNOSIS To diagnose a UTI, your caregiver will ask you about your symptoms. Your caregiver will also ask you to provide a urine sample. The urine sample will be tested for bacteria and white blood cells. White blood cells are made by your body to help fight infection. TREATMENT  Typically, UTIs can be treated with medication. Because most UTIs are caused by a bacterial infection, they usually can be treated with the use of antibiotics. The choice of antibiotic and length of treatment depend on your symptoms and the type of bacteria causing your infection. HOME CARE INSTRUCTIONS  If you were prescribed antibiotics, take them exactly as your caregiver instructs you. Finish the medication even if you feel better after you have only taken some of the medication.  Drink enough water and fluids to keep your urine clear or pale yellow.  Avoid caffeine, tea, and carbonated beverages. They tend to irritate your bladder.  Empty your bladder often. Avoid holding urine for long periods of time.  Empty your bladder before and after sexual intercourse.  After a bowel movement, women should cleanse from front to back. Use each tissue only once. SEEK MEDICAL CARE IF:   You have back pain.  You develop a fever.  Your symptoms do not begin to resolve within 3 days. SEEK IMMEDIATE MEDICAL CARE IF:   You have severe back pain or lower abdominal pain.  You develop chills.  You have nausea or vomiting.  You have continued burning or  discomfort with urination. MAKE SURE YOU:   Understand these instructions.  Will watch your condition.  Will get help  right away if you are not doing well or get worse.   This information is not intended to replace advice given to you by your health care provider. Make sure you discuss any questions you have with your health care provider.   Document Released: 04/16/2005 Document Revised: 03/28/2015 Document Reviewed: 08/15/2011 Elsevier Interactive Patient Education [employer].

## 2016-01-10 LAB — URINE CULTURE: Culture: NO GROWTH

## 2016-03-31 DIAGNOSIS — N183 Chronic kidney disease, stage 3 (moderate): Secondary | ICD-10-CM | POA: Diagnosis not present

## 2016-03-31 DIAGNOSIS — K219 Gastro-esophageal reflux disease without esophagitis: Secondary | ICD-10-CM | POA: Diagnosis not present

## 2016-03-31 DIAGNOSIS — K529 Noninfective gastroenteritis and colitis, unspecified: Secondary | ICD-10-CM | POA: Diagnosis not present

## 2016-03-31 DIAGNOSIS — J300 Vasomotor rhinitis: Secondary | ICD-10-CM | POA: Insufficient documentation

## 2016-03-31 DIAGNOSIS — R002 Palpitations: Secondary | ICD-10-CM | POA: Diagnosis not present

## 2016-03-31 DIAGNOSIS — E7800 Pure hypercholesterolemia, unspecified: Secondary | ICD-10-CM | POA: Diagnosis not present

## 2016-03-31 DIAGNOSIS — D696 Thrombocytopenia, unspecified: Secondary | ICD-10-CM | POA: Diagnosis not present

## 2016-03-31 DIAGNOSIS — E538 Deficiency of other specified B group vitamins: Secondary | ICD-10-CM | POA: Diagnosis not present

## 2016-04-14 DIAGNOSIS — D485 Neoplasm of uncertain behavior of skin: Secondary | ICD-10-CM | POA: Diagnosis not present

## 2016-04-14 DIAGNOSIS — C44311 Basal cell carcinoma of skin of nose: Secondary | ICD-10-CM | POA: Diagnosis not present

## 2016-04-14 DIAGNOSIS — C44319 Basal cell carcinoma of skin of other parts of face: Secondary | ICD-10-CM | POA: Diagnosis not present

## 2016-04-14 DIAGNOSIS — L578 Other skin changes due to chronic exposure to nonionizing radiation: Secondary | ICD-10-CM | POA: Diagnosis not present

## 2016-06-03 DIAGNOSIS — L821 Other seborrheic keratosis: Secondary | ICD-10-CM | POA: Diagnosis not present

## 2016-06-03 DIAGNOSIS — Z85828 Personal history of other malignant neoplasm of skin: Secondary | ICD-10-CM | POA: Diagnosis not present

## 2016-06-03 DIAGNOSIS — Z1283 Encounter for screening for malignant neoplasm of skin: Secondary | ICD-10-CM | POA: Diagnosis not present

## 2016-06-03 DIAGNOSIS — L720 Epidermal cyst: Secondary | ICD-10-CM | POA: Diagnosis not present

## 2016-06-03 DIAGNOSIS — L570 Actinic keratosis: Secondary | ICD-10-CM | POA: Diagnosis not present

## 2016-06-03 DIAGNOSIS — Z08 Encounter for follow-up examination after completed treatment for malignant neoplasm: Secondary | ICD-10-CM | POA: Diagnosis not present

## 2016-06-03 DIAGNOSIS — D485 Neoplasm of uncertain behavior of skin: Secondary | ICD-10-CM | POA: Diagnosis not present

## 2016-10-13 DIAGNOSIS — L2089 Other atopic dermatitis: Secondary | ICD-10-CM | POA: Diagnosis not present

## 2016-10-27 DIAGNOSIS — L570 Actinic keratosis: Secondary | ICD-10-CM | POA: Diagnosis not present

## 2016-10-27 DIAGNOSIS — L821 Other seborrheic keratosis: Secondary | ICD-10-CM | POA: Diagnosis not present

## 2016-10-27 DIAGNOSIS — L2089 Other atopic dermatitis: Secondary | ICD-10-CM | POA: Diagnosis not present

## 2016-11-11 DIAGNOSIS — G9389 Other specified disorders of brain: Secondary | ICD-10-CM | POA: Diagnosis not present

## 2016-11-11 DIAGNOSIS — R404 Transient alteration of awareness: Secondary | ICD-10-CM | POA: Diagnosis not present

## 2016-11-11 DIAGNOSIS — J9811 Atelectasis: Secondary | ICD-10-CM | POA: Diagnosis not present

## 2016-11-11 DIAGNOSIS — Z5181 Encounter for therapeutic drug level monitoring: Secondary | ICD-10-CM | POA: Diagnosis not present

## 2016-11-11 DIAGNOSIS — R5381 Other malaise: Secondary | ICD-10-CM | POA: Diagnosis not present

## 2016-11-11 DIAGNOSIS — R531 Weakness: Secondary | ICD-10-CM | POA: Diagnosis not present

## 2016-11-11 DIAGNOSIS — R4182 Altered mental status, unspecified: Secondary | ICD-10-CM | POA: Diagnosis not present

## 2016-11-11 DIAGNOSIS — R410 Disorientation, unspecified: Secondary | ICD-10-CM | POA: Diagnosis not present

## 2016-11-11 DIAGNOSIS — Z79899 Other long term (current) drug therapy: Secondary | ICD-10-CM | POA: Diagnosis not present

## 2016-11-11 DIAGNOSIS — R918 Other nonspecific abnormal finding of lung field: Secondary | ICD-10-CM | POA: Diagnosis not present

## 2016-11-11 DIAGNOSIS — Z87891 Personal history of nicotine dependence: Secondary | ICD-10-CM | POA: Diagnosis not present

## 2016-11-18 DIAGNOSIS — L500 Allergic urticaria: Secondary | ICD-10-CM | POA: Diagnosis not present

## 2016-11-18 DIAGNOSIS — L2389 Allergic contact dermatitis due to other agents: Secondary | ICD-10-CM | POA: Diagnosis not present

## 2016-11-18 DIAGNOSIS — L2089 Other atopic dermatitis: Secondary | ICD-10-CM | POA: Diagnosis not present

## 2016-12-08 DIAGNOSIS — L2089 Other atopic dermatitis: Secondary | ICD-10-CM | POA: Diagnosis not present

## 2016-12-16 DIAGNOSIS — G40909 Epilepsy, unspecified, not intractable, without status epilepticus: Secondary | ICD-10-CM | POA: Diagnosis not present

## 2017-02-02 ENCOUNTER — Ambulatory Visit
Admission: EM | Admit: 2017-02-02 | Discharge: 2017-02-02 | Disposition: A | Discharge status: Home / Self Care | Payer: Medicare Other | Attending: Family Medicine | Admitting: Family Medicine

## 2017-02-02 ENCOUNTER — Encounter: Payer: Self-pay | Admitting: *Deleted

## 2017-02-02 DIAGNOSIS — B86 Scabies: Secondary | ICD-10-CM | POA: Diagnosis not present

## 2017-02-02 DIAGNOSIS — R21 Rash and other nonspecific skin eruption: Secondary | ICD-10-CM

## 2017-02-02 MED ORDER — PERMETHRIN 5 % EX CREA: TOPICAL_CREAM | CUTANEOUS | 1 refills | Status: DC

## 2017-02-02 NOTE — ED Triage Notes (Signed)
Patient had symptom of rash on his back for 6 months. Patient has made 4 visits to the dermatologist that has not resolved his symptoms.

## 2017-02-02 NOTE — ED Provider Notes (Signed)
[facility]    CSN: [id number] Arrival date & time: 02/02/17  1408     History   Chief Complaint Chief Complaint  Patient presents with  . Rash    HPI Taylor Hogan is a 83 y.o. male.   Patient is a 14-year-old white male with a rash over his back and a few lesions on his hands. He states the last 6 months he's been having recurrent trouble with this rash on his back. He has seen a dermatologist and he has had biopsy show urticaria but no other specific entity. He states his been itching scratching his back. This been occasional lesions on his hand. He states that it will swell and then his wife states fluid come out of the lesion. He's been scratching this a lot and finally came here to be seen to get second opinion and see if we had anything to offer him. He does have a history of epilepsy he was recently diagnosed with that. He's had tonsillectomy.   The history is provided by the patient and the spouse. No language interpreter was used.  Rash  Location:  Torso, hand and shoulder/arm Shoulder/arm rash location:  L hand and R hand Quality: draining, redness and swelling   Severity:  Severe Duration:  6 weeks Timing:  Constant Progression:  Worsening Chronicity:  Chronic Context: animal contact and plant contact   Context: not insect bite/sting and not new detergent/soap   Relieved by:  Nothing Ineffective treatments:  Topical steroids   History reviewed. No pertinent past medical history.  There are no active problems to display for this patient.   Past Surgical History:  Procedure Laterality Date  . TONSILLECTOMY         Home Medications    Prior to Admission medications   Medication Sig Start Date End Date Taking? Authorizing Provider  levETIRAcetam (KEPPRA) 500 MG tablet Take 500 mg by mouth 2 (two) times daily.   Yes [provider]  triamcinolone cream (KENALOG) 0.1 % Apply 1 application topically 2 (two) times daily.   Yes  [provider]  permethrin (ELIMITE) 5 % cream Apply to affected area once 02/02/17   Frederich Cha, MD    Family History History reviewed. No pertinent family history.  Social History Social History  Substance Use Topics  . Smoking status: Former Research scientist (life sciences)  . Smokeless tobacco: Former Systems developer  . Alcohol use No     Allergies   Sulfa antibiotics   Review of Systems Review of Systems  Skin: Positive for rash.  All other systems reviewed and are negative.    Physical Exam Triage Vital Signs ED Triage Vitals  Enc Vitals Group     BP 02/02/17 1509 136/80     Pulse Rate 02/02/17 1509 95     Resp 02/02/17 1509 16     Temp 02/02/17 1509 (!) 97.5 F (36.4 C)     Temp Source 02/02/17 1509 Oral     SpO2 02/02/17 1509 100 %     Weight 02/02/17 1511 122 lb (55.3 kg)     Height 02/02/17 1511 5\' 7"  (1.702 m)     Head Circumference --      Peak Flow --      Pain Score 02/02/17 1511 0     Pain Loc --      Pain Edu? --      Excl. in [place]? --    No data found.   Updated Vital Signs BP 136/80 (BP  Location: Left Arm)   Pulse 95   Temp (!) 97.5 F (36.4 C) (Oral)   Resp 16   Ht 5\' 7"  (1.702 m)   Wt 122 lb (55.3 kg)   SpO2 100%   BMI 19.11 kg/m   Visual Acuity Right Eye Distance:   Left Eye Distance:   Bilateral Distance:    Right Eye Near:   Left Eye Near:    Bilateral Near:     Physical Exam  Constitutional: He appears well-developed and well-nourished.  HENT:  Head: Normocephalic and atraumatic.  Eyes: Pupils are equal, round, and reactive to light.  Neck: Normal range of motion. Neck supple.  Pulmonary/Chest: Breath sounds normal.  Musculoskeletal: Normal range of motion.  Neurological: He is alert.  Skin: Rash noted. There is erythema.     He has a few lesions on his hand on the dorsum of his hand most lesions on his back on both sides back is marked excoriations where he's been scratching and is a very small individual lesions that were present. Some  of them are coalesced because the mouth scratching that he's done but appears to start off a small lesions.  Psychiatric: He has a normal mood and affect.  Vitals reviewed.    UC Treatments / Results  Labs (all labs ordered are listed, but only abnormal results are displayed) Labs Reviewed - No data to display  EKG  EKG Interpretation None       Radiology No results found.  Procedures Procedures (including critical care time)  Medications Ordered in UC Medications - No data to display   Initial Impression / Assessment and Plan / UC Course  I have reviewed the triage vital signs and the nursing notes.  Pertinent labs & imaging results that were available during my care of the patient were reviewed by me and considered in my medical decision making (see chart for details).    The rash looks like O scabies infection that can scratched and deroofed.  The rash. When they took in the stray dog and the dog does state he is entirely treat the dog mites I recommend they take the daughter that to see if the dog has scabies. His wife has not developed any problems but they do not sleep together due to their different tolerance to heat and cold.    Treat with Elimite lotion. Follow-up with PCP or dermatologist twice not better in 3 weeks may repeat Elimite one time in one week if needed. Recommend Claritin 10 mg and Zantac 150 twice a day for the itching Final Clinical Impressions(s) / UC Diagnoses   Final diagnoses:  Rash  Scabies    New Prescriptions New Prescriptions   PERMETHRIN (ELIMITE) 5 % CREAM    Apply to affected area once     Note: This dictation was prepared with Dragon dictation along with smaller phrase technology. Any transcriptional errors that result from this process are unintentional.   Frederich Cha, MD 02/02/17 1640

## 2017-02-03 DIAGNOSIS — R21 Rash and other nonspecific skin eruption: Secondary | ICD-10-CM | POA: Diagnosis not present

## 2017-03-31 DIAGNOSIS — D696 Thrombocytopenia, unspecified: Secondary | ICD-10-CM | POA: Diagnosis not present

## 2017-03-31 DIAGNOSIS — N183 Chronic kidney disease, stage 3 (moderate): Secondary | ICD-10-CM | POA: Diagnosis not present

## 2017-03-31 DIAGNOSIS — Z79899 Other long term (current) drug therapy: Secondary | ICD-10-CM | POA: Diagnosis not present

## 2017-03-31 DIAGNOSIS — Z23 Encounter for immunization: Secondary | ICD-10-CM | POA: Diagnosis not present

## 2017-03-31 DIAGNOSIS — G40909 Epilepsy, unspecified, not intractable, without status epilepticus: Secondary | ICD-10-CM | POA: Diagnosis not present

## 2017-03-31 DIAGNOSIS — E7800 Pure hypercholesterolemia, unspecified: Secondary | ICD-10-CM | POA: Diagnosis not present

## 2017-03-31 DIAGNOSIS — E876 Hypokalemia: Secondary | ICD-10-CM | POA: Diagnosis not present

## 2017-03-31 DIAGNOSIS — E538 Deficiency of other specified B group vitamins: Secondary | ICD-10-CM | POA: Diagnosis not present

## 2017-03-31 DIAGNOSIS — L299 Pruritus, unspecified: Secondary | ICD-10-CM | POA: Diagnosis not present

## 2017-03-31 DIAGNOSIS — R898 Other abnormal findings in specimens from other organs, systems and tissues: Secondary | ICD-10-CM | POA: Insufficient documentation

## 2017-06-23 DIAGNOSIS — Z961 Presence of intraocular lens: Secondary | ICD-10-CM | POA: Diagnosis not present

## 2017-11-26 DIAGNOSIS — Z8673 Personal history of transient ischemic attack (TIA), and cerebral infarction without residual deficits: Secondary | ICD-10-CM | POA: Insufficient documentation

## 2018-02-04 ENCOUNTER — Other Ambulatory Visit: Payer: Self-pay | Admitting: Acute Care

## 2018-02-04 DIAGNOSIS — G3184 Mild cognitive impairment, so stated: Secondary | ICD-10-CM | POA: Insufficient documentation

## 2018-02-04 DIAGNOSIS — R569 Unspecified convulsions: Secondary | ICD-10-CM | POA: Insufficient documentation

## 2018-02-04 DIAGNOSIS — Z8673 Personal history of transient ischemic attack (TIA), and cerebral infarction without residual deficits: Secondary | ICD-10-CM

## 2018-02-23 ENCOUNTER — Ambulatory Visit
Admission: RE | Admit: 2018-02-23 | Discharge: 2018-02-23 | Disposition: A | Discharge status: Home / Self Care | Payer: Medicare Other | Source: Ambulatory Visit | Attending: Acute Care | Admitting: Acute Care

## 2018-02-23 DIAGNOSIS — G40909 Epilepsy, unspecified, not intractable, without status epilepticus: Secondary | ICD-10-CM | POA: Diagnosis present

## 2018-02-23 DIAGNOSIS — Z8673 Personal history of transient ischemic attack (TIA), and cerebral infarction without residual deficits: Secondary | ICD-10-CM | POA: Diagnosis present

## 2018-02-23 DIAGNOSIS — Z9189 Other specified personal risk factors, not elsewhere classified: Secondary | ICD-10-CM | POA: Insufficient documentation

## 2020-10-19 DIAGNOSIS — F32A Depression, unspecified: Secondary | ICD-10-CM | POA: Insufficient documentation

## 2020-12-25 ENCOUNTER — Telehealth: Payer: Self-pay | Admitting: *Deleted

## 2020-12-25 NOTE — Telephone Encounter (Addendum)
Sarah, NP called and states that  additional labs came back today that were not sent yesterday and she has more labs pending results and she feels he is GI Bleeding, but does not have any hemoccults on him. Please see care everywhere and call her if there are any questions

## 2020-12-26 ENCOUNTER — Inpatient Hospital Stay: Payer: Medicare Other

## 2020-12-26 ENCOUNTER — Inpatient Hospital Stay: Payer: Medicare Other | Attending: Oncology | Admitting: Oncology

## 2020-12-26 ENCOUNTER — Encounter: Payer: Self-pay | Admitting: Oncology

## 2020-12-26 ENCOUNTER — Other Ambulatory Visit: Payer: Self-pay

## 2020-12-26 VITALS — BP 104/61 | HR 92 | Temp 97.0°F | Wt 131.0 lb

## 2020-12-26 DIAGNOSIS — F419 Anxiety disorder, unspecified: Secondary | ICD-10-CM | POA: Insufficient documentation

## 2020-12-26 DIAGNOSIS — R7989 Other specified abnormal findings of blood chemistry: Secondary | ICD-10-CM

## 2020-12-26 DIAGNOSIS — Z87891 Personal history of nicotine dependence: Secondary | ICD-10-CM | POA: Insufficient documentation

## 2020-12-26 DIAGNOSIS — K921 Melena: Secondary | ICD-10-CM | POA: Diagnosis not present

## 2020-12-26 DIAGNOSIS — R630 Anorexia: Secondary | ICD-10-CM | POA: Insufficient documentation

## 2020-12-26 DIAGNOSIS — E785 Hyperlipidemia, unspecified: Secondary | ICD-10-CM | POA: Diagnosis not present

## 2020-12-26 DIAGNOSIS — R945 Abnormal results of liver function studies: Secondary | ICD-10-CM | POA: Insufficient documentation

## 2020-12-26 DIAGNOSIS — Z86010 Personal history of colonic polyps: Secondary | ICD-10-CM | POA: Insufficient documentation

## 2020-12-26 DIAGNOSIS — R634 Abnormal weight loss: Secondary | ICD-10-CM | POA: Diagnosis not present

## 2020-12-26 DIAGNOSIS — Z8673 Personal history of transient ischemic attack (TIA), and cerebral infarction without residual deficits: Secondary | ICD-10-CM | POA: Insufficient documentation

## 2020-12-26 DIAGNOSIS — D649 Anemia, unspecified: Secondary | ICD-10-CM

## 2020-12-26 DIAGNOSIS — R5383 Other fatigue: Secondary | ICD-10-CM | POA: Insufficient documentation

## 2020-12-26 DIAGNOSIS — D61818 Other pancytopenia: Secondary | ICD-10-CM | POA: Diagnosis not present

## 2020-12-26 DIAGNOSIS — R112 Nausea with vomiting, unspecified: Secondary | ICD-10-CM | POA: Insufficient documentation

## 2020-12-26 DIAGNOSIS — R569 Unspecified convulsions: Secondary | ICD-10-CM | POA: Diagnosis not present

## 2020-12-26 DIAGNOSIS — C7951 Secondary malignant neoplasm of bone: Secondary | ICD-10-CM | POA: Diagnosis not present

## 2020-12-26 DIAGNOSIS — C61 Malignant neoplasm of prostate: Secondary | ICD-10-CM | POA: Diagnosis not present

## 2020-12-26 DIAGNOSIS — E7800 Pure hypercholesterolemia, unspecified: Secondary | ICD-10-CM | POA: Insufficient documentation

## 2020-12-26 DIAGNOSIS — N183 Chronic kidney disease, stage 3 unspecified: Secondary | ICD-10-CM | POA: Diagnosis not present

## 2020-12-26 DIAGNOSIS — Z9189 Other specified personal risk factors, not elsewhere classified: Secondary | ICD-10-CM

## 2020-12-26 DIAGNOSIS — K219 Gastro-esophageal reflux disease without esophagitis: Secondary | ICD-10-CM | POA: Insufficient documentation

## 2020-12-26 DIAGNOSIS — M1990 Unspecified osteoarthritis, unspecified site: Secondary | ICD-10-CM | POA: Insufficient documentation

## 2020-12-26 LAB — IRON AND TIBC
Iron: 113 ug/dL (ref 45–182)
Saturation Ratios: 37 % (ref 17.9–39.5)
TIBC: 304 ug/dL (ref 250–450)
UIBC: 191 ug/dL

## 2020-12-26 LAB — COMPREHENSIVE METABOLIC PANEL
ALT: 20 U/L (ref 0–44)
AST: 97 U/L — ABNORMAL HIGH (ref 15–41)
Albumin: 3.5 g/dL (ref 3.5–5.0)
Alkaline Phosphatase: 804 U/L — ABNORMAL HIGH (ref 38–126)
Anion gap: 11 (ref 5–15)
BUN: 29 mg/dL — ABNORMAL HIGH (ref 8–23)
CO2: 21 mmol/L — ABNORMAL LOW (ref 22–32)
Calcium: 8.4 mg/dL — ABNORMAL LOW (ref 8.9–10.3)
Chloride: 108 mmol/L (ref 98–111)
Creatinine, Ser: 1.54 mg/dL — ABNORMAL HIGH (ref 0.61–1.24)
GFR, Estimated: 43 mL/min — ABNORMAL LOW (ref >60)
Glucose, Bld: 107 mg/dL — ABNORMAL HIGH (ref 70–99)
Potassium: 4.2 mmol/L (ref 3.5–5.1)
Sodium: 140 mmol/L (ref 135–145)
Total Bilirubin: 0.9 mg/dL (ref 0.3–1.2)
Total Protein: 7.2 g/dL (ref 6.5–8.1)

## 2020-12-26 LAB — CBC WITH DIFFERENTIAL/PLATELET
Abs Immature Granulocytes: 0.41 10*3/uL — ABNORMAL HIGH (ref 0.00–0.07)
Basophils Absolute: 0.1 10*3/uL (ref 0.0–0.1)
Basophils Relative: 1 %
Eosinophils Absolute: 0.2 10*3/uL (ref 0.0–0.5)
Eosinophils Relative: 4 %
HCT: 21.9 % — ABNORMAL LOW (ref 39.0–52.0)
Hemoglobin: 6.7 g/dL — ABNORMAL LOW (ref 13.0–17.0)
Immature Granulocytes: 7 %
Lymphocytes Relative: 27 %
Lymphs Abs: 1.5 10*3/uL (ref 0.7–4.0)
MCH: 28.6 pg (ref 26.0–34.0)
MCHC: 30.6 g/dL (ref 30.0–36.0)
MCV: 93.6 fL (ref 80.0–100.0)
Monocytes Absolute: 0.7 10*3/uL (ref 0.1–1.0)
Monocytes Relative: 12 %
Neutro Abs: 2.8 10*3/uL (ref 1.7–7.7)
Neutrophils Relative %: 49 %
Platelets: 121 10*3/uL — ABNORMAL LOW (ref 150–400)
RBC: 2.34 MIL/uL — ABNORMAL LOW (ref 4.22–5.81)
RDW: 20.1 % — ABNORMAL HIGH (ref 11.5–15.5)
Smear Review: NORMAL
WBC: 5.7 10*3/uL (ref 4.0–10.5)
nRBC: 10.4 % — ABNORMAL HIGH (ref 0.0–0.2)

## 2020-12-26 LAB — RETICULOCYTES
Immature Retic Fract: 37.7 % — ABNORMAL HIGH (ref 2.3–15.9)
RBC.: 2.29 MIL/uL — ABNORMAL LOW (ref 4.22–5.81)
Retic Count, Absolute: 83.6 10*3/uL (ref 19.0–186.0)
Retic Ct Pct: 3.7 % — ABNORMAL HIGH (ref 0.4–3.1)

## 2020-12-26 LAB — FERRITIN: Ferritin: 3158 ng/mL — ABNORMAL HIGH (ref 24–336)

## 2020-12-26 LAB — VITAMIN B12: Vitamin B-12: 634 pg/mL (ref 180–914)

## 2020-12-26 LAB — FOLATE: Folate: 15.1 ng/mL (ref >5.9)

## 2020-12-26 LAB — LACTATE DEHYDROGENASE: LDH: 1815 U/L — ABNORMAL HIGH (ref 98–192)

## 2020-12-26 MED ORDER — FERROUS SULFATE 325 (65 FE) MG PO TBEC: 325.0000 mg | DELAYED_RELEASE_TABLET | Freq: Two times a day (BID) | ORAL | 3 refills | Status: DC

## 2020-12-26 NOTE — Progress Notes (Signed)
 [facility]  [phone] [phone]  ID: Taylor Hogan OB: 03/25/1930  MR#: [id number]  CSN#:[id number]  Patient Care Team: Gauger,  Kathryn, NP as PCP - General (Internal Medicine)  CHIEF COMPLAINT: Anemia   INTERVAL HISTORY: Taylor Hogan is an 87 year old male with PMH significant for HTN, CKD, stage 3b, HLD, Seizures, CVA, and mild cognitive impairment.   He is seen in consultation from  Gauger, NP for concern about low hemoglobin. Hemoglobin was reported as 6.2 on 12/24/20. Previously around 13 about 9 months ago.   [name] presents with his son, Taylor Hogan at this appointment today. Reports black tarry stools daily, poor appetite, nausea/vomiting with food.  He says everything he eats takes like "poop." He reports sweet items "taste normal." His son reports they cannot get him to drink or eat "anything" except for pepsi.   His son reports a 10 pound weight loss over the past month. Son notes a decline in his intake over the past 2-3 weeks. Reports weakness and fatigue and seems to sleep more.   He is currently taking iron supplements when he remembers. He denies abdominal pain. Exam in normal.   Reports a previous colonoscopy many years ago. Had a few polyps removed but otherwise was reported as normal.   He feels tired and weak today. Denies any ice pica.   REVIEW OF SYSTEMS:   Review of Systems  Constitutional:  Positive for malaise/fatigue and weight loss. Negative for chills and fever.  HENT:  Negative for congestion, ear pain and tinnitus.   Eyes: Negative.  Negative for blurred vision and double vision.  Respiratory: Negative.  Negative for cough, sputum production and shortness of breath.   Cardiovascular: Negative.  Negative for chest pain, palpitations and leg swelling.  Gastrointestinal:  Positive for blood in stool. Negative for abdominal pain, constipation, diarrhea, nausea and vomiting.  Genitourinary:  Negative  for dysuria, frequency and urgency.  Musculoskeletal:  Negative for back pain and falls.  Skin: Negative.  Negative for rash.  Neurological:  Positive for weakness. Negative for headaches.  Endo/Heme/Allergies:  Bruises/bleeds easily.  Psychiatric/Behavioral: Negative.  Negative for depression. The patient is not nervous/anxious and does not have insomnia.    As per HPI. Otherwise, a complete review of systems is negative.  PAST MEDICAL HISTORY: Past Medical History:  Diagnosis Date   Anxiety    Cancer (HCC)    skin   Cataract    Depression     PAST SURGICAL HISTORY: Past Surgical History:  Procedure Laterality Date   CATARACT EXTRACTION  2012   polyp removal     TONSILLECTOMY      FAMILY HISTORY: Family History  Problem Relation Age of Onset   Emphysema Mother     ADVANCED DIRECTIVES (Y/N):  N  HEALTH MAINTENANCE: Social History   Tobacco Use   Smoking status: Former    Pack years: 0.00   Smokeless tobacco: Former  Substance Use Topics   Alcohol use: No   Drug use: No   ppd smoker since age 14. Quit in 1984.  Chew tobacco since age 14. Still currently using.   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     ferrous sulfate 325 (65 FE) MG EC tablet Take 1 tablet (325 mg total) by mouth 2 (two) times daily with a meal. 30 tablet 3   levETIRAcetam (KEPPRA) 500 MG tablet Take 500 mg by mouth 2 (two) times daily.     lisinopril (ZESTRIL) 5 MG tablet Take by mouth.     permethrin (ELIMITE) 5 % cream Apply to affected area once 60 g 1   triamcinolone cream (KENALOG) 0.1 % Apply 1 application topically 2 (two) times daily.     No  current facility-administered medications for this visit.    OBJECTIVE: Vitals:   12/26/20 1431  BP: 104/61  Pulse: 92  Temp: (!) 97 F (36.1 C)  SpO2: 100%     Body mass index is 20.52 kg/m.    ECOG FS:1 - Symptomatic but completely ambulatory      LAB RESULTS:  Lab Results  Component Value Date   NA 140 12/26/2020   K 4.2 12/26/2020   CL 108 12/26/2020   CO2 21 (L) 12/26/2020   GLUCOSE 107 (H) 12/26/2020   BUN 29 (H) 12/26/2020   CREATININE 1.54 (H) 12/26/2020   CALCIUM 8.4 (L) 12/26/2020   PROT 7.2 12/26/2020   ALBUMIN 3.5 12/26/2020   AST 97 (H) 12/26/2020   ALT 20 12/26/2020   ALKPHOS 804 (H) 12/26/2020   BILITOT 0.9 12/26/2020   GFRNONAA 43 (L) 12/26/2020   GFRAA 57 (L) 01/08/2016    Lab Results  Component Value Date   WBC 5.7 12/26/2020   NEUTROABS 2.8 12/26/2020   HGB 6.7 (L) 12/26/2020   HCT 21.9 (L) 12/26/2020   MCV 93.6 12/26/2020   PLT 121 (L) 12/26/2020     STUDIES: No results found.  ASSESSMENT: Mr. Risse presents for anemia.   Anemia: Acute onset Hemoglobin from September 2021 was 13.  No previous history. Is taking Iron supplements inconsistently. Previous colonoscopy/EGD essentially neagtive many years ago.  Had a CT A/P in March 2008-showed some nodular densities in chest, multiple leisons throughout right and left hepatic lobes-likely hepatic cysts.   CKD: Stable Kidney fucntion- 1.6 (baseline)  GFR 41 (CKD, stage 3b)    Elevated LFTs AST 133 (high), ALT 16 (normal) Alk Phos. 879  Weight Loss: Concerned about PO intake over the last 2-3 weeks  Patient not eating. Reports drinking 12 ounces of pepsi/day  Eating sweets only Consult Dietician  Plan:  Anemia: Repeat labs today (CBC, CMP, Ferritin, B12, Iron, Copper, folate, Reticulytes, LDH) Plan to get him a blood transfusion ASAP.  Refrral to GI.   Weight Loss: Unclear etiology Dietary consult Imaging? CT CAP.  Former smoker, current use of  snuff  Disposition:  RTC in 1 week for follow-up and to review labs + CBC.   1 Unit PRBC ASAP-12/27/20 scheduled.   Labs today (CBC, CMP, B12, TIBC, Iron, Ferritin, Folate, Retic, peripheral smear) Referral to dietician  Referral to GI ASAP- Scheduled to see Dr. Vanga in July (will try and get this moved up).  Patient expressed understanding and was in agreement with this plan. He also understands that He can call clinic at any time with any questions, concerns, or complaints.   Cancer Staging No matching staging information was found for the patient.  The patient's diagnosis, an outline of the further diagnostic and laboratory studies which will be required, the recommendation for surgery, and alternatives were discussed with her and her accompanying family members.    All questions were answered to their satisfaction.  I personally had a face to face interaction and evaluated the patient jointly with the NP Student, Mrs.  .  I have reviewed her history and available records and have performed the key portions of the physical exam including general, HEENT, abdominal exam, pelvic exam with my findings confirming those documented above by the APP student.  I have discussed the case with the APP student and the patient.  I agree with the above documentation, assessment and plan which was fully formulated by me.  Counseling was completed by me.     , Student FNP  Jennifer Burns, NP 12/30/2020 7:40 PM   

## 2020-12-26 NOTE — Progress Notes (Signed)
Patient here for oncology follow-up appointment, expresses  concerns of dark black stool and burising, weakness, no appetite, loss of taste, NP notified

## 2020-12-27 ENCOUNTER — Encounter: Payer: Self-pay | Admitting: *Deleted

## 2020-12-27 ENCOUNTER — Other Ambulatory Visit: Payer: Self-pay | Admitting: Oncology

## 2020-12-27 ENCOUNTER — Inpatient Hospital Stay: Payer: Medicare Other

## 2020-12-27 DIAGNOSIS — C61 Malignant neoplasm of prostate: Secondary | ICD-10-CM | POA: Diagnosis not present

## 2020-12-27 DIAGNOSIS — D649 Anemia, unspecified: Secondary | ICD-10-CM

## 2020-12-27 LAB — COPPER, SERUM: Copper: 170 ug/dL — ABNORMAL HIGH (ref 69–132)

## 2020-12-27 LAB — PREPARE RBC (CROSSMATCH)

## 2020-12-27 LAB — ABO/RH: ABO/RH(D): O NEG

## 2020-12-27 MED ORDER — ACETAMINOPHEN 325 MG PO TABS
650.0000 mg | ORAL_TABLET | Freq: Once | ORAL | Status: AC
  Administered 2020-12-27: 650 mg via ORAL
  Filled 2020-12-27: qty 2

## 2020-12-27 MED ORDER — DIPHENHYDRAMINE HCL 50 MG/ML IJ SOLN
25.0000 mg | Freq: Once | INTRAMUSCULAR | Status: AC
  Administered 2020-12-27: 25 mg via INTRAVENOUS
  Filled 2020-12-27: qty 1

## 2020-12-27 MED ORDER — SODIUM CHLORIDE 0.9% FLUSH
10.0000 mL | INTRAVENOUS | Status: DC | PRN
  Filled 2020-12-27: qty 10

## 2020-12-27 MED ORDER — SODIUM CHLORIDE 0.9% IV SOLUTION
250.0000 mL | Freq: Once | INTRAVENOUS | Status: AC
  Administered 2020-12-27: 250 mL via INTRAVENOUS
  Filled 2020-12-27: qty 250

## 2020-12-28 LAB — TYPE AND SCREEN
ABO/RH(D): O NEG
Antibody Screen: NEGATIVE
Unit division: 0

## 2020-12-28 LAB — BPAM RBC
Blood Product Expiration Date: 202206112359
ISSUE DATE / TIME: 202206091238
Unit Type and Rh: 9500

## 2020-12-31 ENCOUNTER — Telehealth: Payer: Self-pay

## 2020-12-31 NOTE — Telephone Encounter (Signed)
-----   Message from Jonathon Bellows, MD sent at 12/31/2020  3:32 PM EDT ----- Regarding: RE: Urgent Gi referral Caryl Pina or Herb Grays   Can you see if we have an appointment sooner like this week or next week with myself or Dr. Marius Ditch whoever has an opening.  With a hemoglobin of 6.2 I think we need to see him sooner than in July  Kiran    ----- Message ----- From: Jacquelin Hawking, NP Sent: 12/28/2020   2:06 PM EDT To: Jonathon Bellows, MD Subject: Urgent Gi referral                             Good afternoon Dr. Vicente Males,  I hope you and Dr. Janese Banks had a great vacation.  Wanted to reach out and ask if you could fit this gentleman in.  He is actively bleeding and was seen by me for anemia and a hemoglobin of 6.2.  About a month ago his hemoglobin was low but stable.  He is not interested in invasive procedures but was agreeable to be seen by GI.  I gave him a unit of blood and he will get some IV iron as well.  I think he was scheduled with Dr. Marius Ditch but it is not until late July.  The son is very eager to have him seen sooner.  Let me know if we can possibly get him in sooner.  Faythe Casa, NP 12/28/2020 2:06 PM

## 2020-12-31 NOTE — Telephone Encounter (Signed)
Called patient wife and got him move to Thursday at 2. Wife states that she will give [place] a call back if her son can not bring him

## 2021-01-01 NOTE — Telephone Encounter (Signed)
Okay, will see him  RV

## 2021-01-02 ENCOUNTER — Inpatient Hospital Stay: Payer: Medicare Other

## 2021-01-02 ENCOUNTER — Encounter: Payer: Self-pay | Admitting: Oncology

## 2021-01-02 ENCOUNTER — Inpatient Hospital Stay (HOSPITAL_BASED_OUTPATIENT_CLINIC_OR_DEPARTMENT_OTHER): Payer: Medicare Other | Admitting: Oncology

## 2021-01-02 ENCOUNTER — Other Ambulatory Visit: Payer: Self-pay

## 2021-01-02 VITALS — BP 120/76 | HR 92 | Temp 97.0°F | Resp 18 | Wt 122.9 lb

## 2021-01-02 DIAGNOSIS — D649 Anemia, unspecified: Secondary | ICD-10-CM

## 2021-01-02 DIAGNOSIS — C61 Malignant neoplasm of prostate: Secondary | ICD-10-CM | POA: Diagnosis not present

## 2021-01-02 LAB — CBC WITH DIFFERENTIAL/PLATELET
Abs Immature Granulocytes: 0.43 10*3/uL — ABNORMAL HIGH (ref 0.00–0.07)
Basophils Absolute: 0.1 10*3/uL (ref 0.0–0.1)
Basophils Relative: 1 %
Eosinophils Absolute: 0.2 10*3/uL (ref 0.0–0.5)
Eosinophils Relative: 3 %
HCT: 25 % — ABNORMAL LOW (ref 39.0–52.0)
Hemoglobin: 8 g/dL — ABNORMAL LOW (ref 13.0–17.0)
Immature Granulocytes: 8 %
Lymphocytes Relative: 26 %
Lymphs Abs: 1.4 10*3/uL (ref 0.7–4.0)
MCH: 30.1 pg (ref 26.0–34.0)
MCHC: 32 g/dL (ref 30.0–36.0)
MCV: 94 fL (ref 80.0–100.0)
Monocytes Absolute: 0.6 10*3/uL (ref 0.1–1.0)
Monocytes Relative: 12 %
Neutro Abs: 2.5 10*3/uL (ref 1.7–7.7)
Neutrophils Relative %: 50 %
Platelets: 101 10*3/uL — ABNORMAL LOW (ref 150–400)
RBC: 2.66 MIL/uL — ABNORMAL LOW (ref 4.22–5.81)
RDW: 18.3 % — ABNORMAL HIGH (ref 11.5–15.5)
WBC: 5.2 10*3/uL (ref 4.0–10.5)
nRBC: 7.2 % — ABNORMAL HIGH (ref 0.0–0.2)

## 2021-01-02 LAB — COMPREHENSIVE METABOLIC PANEL
ALT: 16 U/L (ref 0–44)
AST: 42 U/L — ABNORMAL HIGH (ref 15–41)
Albumin: 3.5 g/dL (ref 3.5–5.0)
Alkaline Phosphatase: 767 U/L — ABNORMAL HIGH (ref 38–126)
Anion gap: 9 (ref 5–15)
BUN: 24 mg/dL — ABNORMAL HIGH (ref 8–23)
CO2: 24 mmol/L (ref 22–32)
Calcium: 8.6 mg/dL — ABNORMAL LOW (ref 8.9–10.3)
Chloride: 109 mmol/L (ref 98–111)
Creatinine, Ser: 1.44 mg/dL — ABNORMAL HIGH (ref 0.61–1.24)
GFR, Estimated: 47 mL/min — ABNORMAL LOW (ref >60)
Glucose, Bld: 105 mg/dL — ABNORMAL HIGH (ref 70–99)
Potassium: 4.5 mmol/L (ref 3.5–5.1)
Sodium: 142 mmol/L (ref 135–145)
Total Bilirubin: 0.6 mg/dL (ref 0.3–1.2)
Total Protein: 7.2 g/dL (ref 6.5–8.1)

## 2021-01-02 LAB — PATHOLOGIST SMEAR REVIEW

## 2021-01-02 NOTE — Progress Notes (Signed)
Pt in for follow up reports being cold all the time and weak.

## 2021-01-02 NOTE — Progress Notes (Signed)
[facility]  [phone] [phone]  ID: Taylor Hogan OB: May 30, 1930  MR#: [id number]  XIP#:[id number]  Patient Care Team: Sallee Lange, NP as PCP - General (Internal Medicine)  CHIEF COMPLAINT: Anemia   INTERVAL HISTORY: Taylor Hogan. Taylor Hogan is an 87 year old male with PMH significant for HTN, CKD, stage 3b, HLD, Seizures, CVA, and mild cognitive impairment.   He is seen in consultation from Gaetano Net, NP for concern about low hemoglobin. Hemoglobin was reported as 6.2 on 12/24/20. Previously around 13 about 9 months ago.   Taylor Hogan presents with his son, Taylor Hogan at this follow up appointment today. Reports ongoing black tarry stools daily, but improved appetite. Reports decreased nausea/vomiting with food intake and over the past week has been able to tolerate fried chicken, pimento cheese, and sandwiches.  He reports some improvement in taste. He reports sweet items "taste normal." His son reports they cannot get him to drink or eat "anything" except for pepsi.   Reports on-going weakness and fatigue this past week.   He is currently taking iron supplements when he remembers. He denies abdominal pain. Exam in normal.   Reports a previous colonoscopy many years ago. Had a few polyps removed but otherwise was reported as normal.   He feels tired and weak today, however this is slightly improved from last week. Denies any ice pica.   REVIEW OF SYSTEMS:   Review of Systems  Constitutional:  Positive for malaise/fatigue and weight loss. Negative for chills and fever.  HENT:  Negative for congestion, ear pain and tinnitus.   Eyes: Negative.  Negative for blurred vision and double vision.  Respiratory: Negative.  Negative for cough, sputum production and shortness of breath.   Cardiovascular: Negative.  Negative for chest pain, palpitations and leg swelling.  Gastrointestinal:  Positive for blood in stool. Negative for abdominal pain,  constipation, diarrhea, nausea and vomiting.  Genitourinary:  Negative for dysuria, frequency and urgency.  Musculoskeletal:  Negative for back pain and falls.  Skin: Negative.  Negative for rash.  Neurological:  Positive for weakness. Negative for headaches.  Endo/Heme/Allergies:  Bruises/bleeds easily.  Psychiatric/Behavioral: Negative.  Negative for depression. The patient is not nervous/anxious and does not have insomnia.    As per HPI. Otherwise, a complete review of systems is negative.  PAST MEDICAL HISTORY: Past Medical History:  Diagnosis Date   Anxiety    Cancer ([place])    skin   Cataract    Depression     PAST SURGICAL HISTORY: Past Surgical History:  Procedure Laterality Date   CATARACT EXTRACTION  2012   polyp removal     TONSILLECTOMY      FAMILY HISTORY: Family History  Problem Relation Age of Onset   Emphysema Mother     ADVANCED DIRECTIVES (Y/N):  N  HEALTH MAINTENANCE: Social History   Tobacco Use   Smoking status: Former    Pack years: 0.00   Smokeless tobacco: Former  Substance Use Topics   Alcohol use: No   Drug use: No   ppd smoker since age 80. Quit in 1984.  Chew tobacco since age 12. Still currently using.   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     ferrous sulfate 325 (65 FE) MG EC tablet Take 1 tablet (325 mg total) by mouth 2 (two) times daily with a meal. 30 tablet 3   levETIRAcetam (KEPPRA) 500 MG tablet Take 500 mg by mouth 2 (two) times daily.     lisinopril (ZESTRIL) 5 MG tablet Take by mouth.     permethrin (ELIMITE) 5 % cream Apply to affected area once 60 g 1   triamcinolone cream (KENALOG)  0.1 % Apply 1 application topically 2 (two) times daily.     No current facility-administered medications for this visit.    OBJECTIVE: There were no vitals filed for this visit.    Body mass index is 19.25 kg/m.    ECOG FS:1 - Symptomatic but completely ambulatory    LAB RESULTS:  Lab Results  Component Value Date   NA 142 01/02/2021   K 4.5 01/02/2021   CL 109 01/02/2021   CO2 24 01/02/2021   GLUCOSE 105 (H) 01/02/2021   BUN 24 (H) 01/02/2021   CREATININE 1.44 (H) 01/02/2021   CALCIUM 8.6 (L) 01/02/2021   PROT 7.2 01/02/2021   ALBUMIN 3.5 01/02/2021   AST 42 (H) 01/02/2021   ALT 16 01/02/2021   ALKPHOS 767 (H) 01/02/2021   BILITOT 0.6 01/02/2021   GFRNONAA 47 (L) 01/02/2021   GFRAA 57 (L) 01/08/2016    Lab Results  Component Value Date   WBC 5.2 01/02/2021   NEUTROABS 2.5 01/02/2021   HGB 8.0 (L) 01/02/2021   HCT 25.0 (L) 01/02/2021   MCV 94.0 01/02/2021   PLT 101 (L) 01/02/2021   Iron/TIBC/Ferritin/ %Sat    Component Value Date/Time   IRON 113 12/26/2020 1548   TIBC 304 12/26/2020 1548   FERRITIN 3,158 (H) 12/26/2020 1548   IRONPCTSAT 37 12/26/2020 1548     STUDIES: No results found.  ASSESSMENT: Taylor Hogan presents for anemia.   Anemia: Acute onset Hemoglobin from September 2021 was 13.  No previous history. Is taking Iron supplements inconsistently. Previous colonoscopy/EGD essentially neagtive many years ago.  Had a CT A/P in March 2008-showed some nodular densities in chest, multiple leisons throughout right and left hepatic lobes-likely hepatic cysts.   CKD: Stable Kidney fucntion- 1.6 (baseline)  GFR 41 (CKD, stage 3b)    Weight Loss: Concerned about PO intake over the last 2-3 weeks  Patient not eating. Reports drinking 12 ounces of pepsi/day  Eating sweets only.    Plan:  Anemia: He received 1 unit packed red blood cells on 12/27/2020 for hemoglobin of 6.7. Labs from 12/26/2020 show an elevated ferritin (3158), elevated LDH  (1815) and an elevated reticulocyte count. Peripheral smear showed From blastic changes, thrombocytopenia and rare circulating blast raise concern for a neoplastic process involving the bone marrow. Discussed labs with patient and son. Consulted with Dr. Rogue Bussing who recommends a bone marrow ASAP if patient is willing. Will cancel GI appointment for tomorrow. He is scheduled for bone marrow on 01/09/2021. He will then return to clinic to discuss with Dr. Rogue Bussing 1 to 2 weeks later.  Weight Loss/Appetite change:  Since last visit he has lost 10 pounds.  Unclear etiology-suggestive of systemic illness malignancy, autoimmune verse others.  Dietary consult completed today. He was given dietary supplements to take home and try.  Will get CT abdomen/pelvis as scheduled on 01/03/2021. Former smoker, current use of snuff.   Disposition:  Bone marrow on 01/09/2021. Cancel GI  appointment scheduled for tomorrow. CT scan scheduled for tomorrow.  Will call with results. RTC 1 to 2 weeks after bone marrow to discuss with Dr. Rogue Bussing and possible blood transfusion.  Addendum: CT abdomen/pelvis was negative for acute abnormality.  Left diverticulosis with mild wall thickening in the mid sigmoid colon related to diverticular disease.  Marked heterogeneous appearance of the bony anatomy.  This could be related to CKD although diffuse bony metastasis involvement cannot be excluded.  Patient expressed understanding and was in agreement with this plan. He also understands that He can call clinic at any time with any questions, concerns, or complaints.   Cancer Staging No matching staging information was found for the patient.  The patient's diagnosis, an outline of the further diagnostic and laboratory studies which will be required, the recommendation for surgery, and alternatives were discussed with her and her accompanying family members.  All questions were answered to their satisfaction.  I  personally had a face to face interaction and evaluated the patient jointly with the NP Student, Mrs. Benedetto Goad.  I have reviewed her history and available records and have performed the key portions of the physical exam including general, HEENT, abdominal exam, pelvic exam with my findings confirming those documented above by the APP student.  I have discussed the case with the APP student and the patient.  I agree with the above documentation, assessment and plan which was fully formulated by me.  Counseling was completed by me.    Benedetto Goad, Student FNP  Greater than 50% was spent in counseling and coordination of care with this patient including but not limited to discussion of the relevant topics above (See A&P) including, but not limited to diagnosis and management of acute and chronic medical conditions.   Faythe Casa, NP 01/02/2021 9:51 AM

## 2021-01-02 NOTE — Progress Notes (Signed)
Nutrition Assessment   Reason for Assessment:  Referral weight loss, poor appetite   ASSESSMENT:  87 year old male with anemia.  Past medical history of HTN, CKD stage 3, HLD, seizure, CVA, mild cognitive impairment.  Patient to be seen by GI tomorrow.   Met with patient and son today in clinic.  Son reports appetite has increased some following transfusion.  Over the last month or so appetite has been declining with eating bites over the past few weeks.  Wife prepares meals.  Son and family also takes patient out to eat.  Patient says that he usually eats cereal or oatmeal or 1/2 egg, bacon, sandwich for breakfast. Lunch is usually 1/2 sandwich.  Wife prepares pinto beans and white beans that patient enjoys.  Son brought chicken nuggets recently and patient ate 1.  Drinks chocolate milk and pepsi.  Reports that he likes the taste of sweet foods.  Majority of foods taste like excrement.      Nutrition Focused Physical Exam: not done.    Medications: fe sulfate    Labs: glucose 105, BUN 24, creatinine 1.44, Cal 8.6, Hgb 8.0   Anthropometrics:   Height: 67 inches Weight: 122 lb 9 oz UBW:  133 lb BMI: 20  Son reports 10 lb weight loss in the last month  8% weight loss in the last month, significant  Estimated Energy Needs  Kcals: 6283-1517 Protein: 82-96 g Fluid: 1.6 L   NUTRITION DIAGNOSIS: Inadequate oral intake related to anemia as evidenced by 8% weight loss in the last month and poor appetite   INTERVENTION:  Discussed ways to add calories and protein in diet.  Handout provided Samples of oral nutrition supplements given to patient to try (ensure complete, [employer], boost plus, orgain). Discussed strategies to help with taste change.  Handout provided.  Contact information given   MONITORING, EVALUATION, GOAL: weight trends, intake   Next Visit: to be determined with clinic visit  Jesselee Poth B. Zenia Resides, [place], [place] Registered Dietitian [phone]  (mobile)

## 2021-01-03 ENCOUNTER — Ambulatory Visit (INDEPENDENT_AMBULATORY_CARE_PROVIDER_SITE_OTHER): Payer: Medicare Other | Admitting: Gastroenterology

## 2021-01-03 ENCOUNTER — Encounter: Payer: Self-pay | Admitting: Gastroenterology

## 2021-01-03 ENCOUNTER — Other Ambulatory Visit: Payer: Self-pay | Admitting: *Deleted

## 2021-01-03 ENCOUNTER — Ambulatory Visit
Admission: RE | Admit: 2021-01-03 | Discharge: 2021-01-03 | Disposition: A | Discharge status: Home / Self Care | Payer: Medicare Other | Source: Ambulatory Visit | Attending: Oncology | Admitting: Oncology

## 2021-01-03 VITALS — BP 122/77 | HR 111 | Temp 97.6°F | Ht 67.0 in | Wt 122.5 lb

## 2021-01-03 DIAGNOSIS — I700 Atherosclerosis of aorta: Secondary | ICD-10-CM | POA: Insufficient documentation

## 2021-01-03 DIAGNOSIS — D649 Anemia, unspecified: Secondary | ICD-10-CM

## 2021-01-03 DIAGNOSIS — K921 Melena: Secondary | ICD-10-CM

## 2021-01-03 DIAGNOSIS — R112 Nausea with vomiting, unspecified: Secondary | ICD-10-CM | POA: Diagnosis not present

## 2021-01-03 DIAGNOSIS — K5730 Diverticulosis of large intestine without perforation or abscess without bleeding: Secondary | ICD-10-CM | POA: Diagnosis not present

## 2021-01-03 DIAGNOSIS — R634 Abnormal weight loss: Secondary | ICD-10-CM | POA: Insufficient documentation

## 2021-01-03 NOTE — Progress Notes (Signed)
bone

## 2021-01-03 NOTE — Progress Notes (Deleted)
Taylor Darby, MD [address]  [place]  [place], [place] [address]  Main: [phone]  Fax: [phone]    Gastroenterology Consultation  Referring Provider:     Jacquelin Hawking, NP Primary Care Physician:  Sallee Lange, NP Primary Gastroenterologist:  Dr. Cephas Hogan Reason for Consultation:     ***        HPI:   Taylor Hogan is a 87 y.o. male referred by Dr. Dayton Martes, Victoriano Lain, NP  for consultation & management of ***  NSAIDs: ***  Antiplts/Anticoagulants/Anti thrombotics: ***  GI Procedures: ***  Past Medical History:  Diagnosis Date   Anxiety    Cancer [facility])    skin   Cataract    Depression     Past Surgical History:  Procedure Laterality Date   CATARACT EXTRACTION  2012   polyp removal     TONSILLECTOMY      Prior to Admission medications   Medication Sig Start Date End Date Taking? Authorizing Provider  lisinopril (ZESTRIL) 5 MG tablet Take by mouth. 11/22/20  Yes [provider]  permethrin (ELIMITE) 5 % cream Apply to affected area once 02/02/17  Yes Frederich Cha, MD  sertraline (ZOLOFT) 50 MG tablet Take 1 tablet by mouth daily. 10/15/20  Yes [provider]  triamcinolone cream (KENALOG) 0.1 % Apply 1 application topically 2 (two) times daily.   Yes [provider]  donepezil (ARICEPT) 5 MG tablet Take by mouth. Patient not taking: No sig reported 12/24/20 12/24/21  [provider]  ferrous sulfate 325 (65 FE) MG EC tablet Take 1 tablet (325 mg total) by mouth 2 (two) times daily with a meal. Patient not taking: No sig reported 12/26/20   Jacquelin Hawking, NP  levETIRAcetam (KEPPRA) 500 MG tablet Take 500 mg by mouth 2 (two) times daily. Patient not taking: Reported on 01/03/2021    [provider]    Family History  Problem Relation Age of Onset   Emphysema Mother      Social History   Tobacco Use   Smoking status: Former    Pack years: 0.00   Smokeless tobacco: Former   Substance Use Topics   Alcohol use: No   Drug use: No    Allergies as of 01/03/2021 - Review Complete 01/03/2021  Allergen Reaction Noted   Amoxicillin Diarrhea 03/16/2014   Aspirin Other (See Comments) 03/16/2014   Lansoprazole Diarrhea 03/16/2014   Sulfa antibiotics Nausea And Vomiting 01/08/2016    Review of Systems:    All systems reviewed and negative except where noted in HPI.   Physical Exam:  BP 122/77 (BP Location: Left Arm, Patient Position: Sitting, Cuff Size: Normal)   Pulse (!) 111   Temp 97.6 F (36.4 C) (Oral)   Ht 5\' 7"  (1.702 m)   Wt 122 lb 8 oz (55.6 kg)   BMI 19.19 kg/m  No LMP for male patient.  General:   Alert,  Well-developed, well-nourished, pleasant and cooperative in NAD Head:  Normocephalic and atraumatic. Eyes:  Sclera clear, no icterus.   Conjunctiva pink. Ears:  Normal auditory acuity. Nose:  No deformity, discharge, or lesions. Mouth:  No deformity or lesions,oropharynx pink & moist. Neck:  Supple; no masses or thyromegaly. Lungs:  Respirations even and unlabored.  Clear throughout to auscultation.   No wheezes, crackles, or rhonchi. No acute distress. Heart:  Regular rate and rhythm; no murmurs, clicks, rubs, or gallops. Abdomen:  Normal bowel sounds. Soft,  non-tender and non-distended without masses, hepatosplenomegaly or hernias noted.  No guarding or rebound tenderness.   Rectal: Not performed Msk:  Symmetrical without gross deformities. Good, equal movement & strength bilaterally. Pulses:  Normal pulses noted. Extremities:  No clubbing or edema.  No cyanosis. Neurologic:  Alert and oriented x3;  grossly normal neurologically. Skin:  Intact without significant lesions or rashes. No jaundice. Lymph Nodes:  No significant cervical adenopathy. Psych:  Alert and cooperative. Normal mood and affect.  Imaging Studies:  Assessment and Plan:   Taylor Hogan is a 87 y.o. male with ***   Follow up in ***   Taylor Darby, MD

## 2021-01-03 NOTE — Progress Notes (Signed)
Taylor Darby, MD [address]  [place]  [place], [place] [address]  Main: [phone]  Fax: [phone]    Gastroenterology Consultation  Referring Provider:     Jacquelin Hawking, NP Primary Care Physician:  Sallee Lange, NP Primary Gastroenterologist:  Dr. Cephas Hogan Reason for Consultation:     Melena        HPI:   Taylor Hogan is a 87 y.o. male referred by Dr. Dayton Martes, Victoriano Lain, NP  for consultation & management of recent history of melena.  Patient reports that for last 2 weeks, he has been noticing that his stools are black but formed.  Today he reports that his stools are much lighter.  He was taking iron, last oral intake was about a week ago.  He does have history of worsening of weakness associated with nausea and poor p.o. intake.  Patient was originally evaluated by his PCP on 6/6 after a fall, underwent routine labs which revealed significant drop in hemoglobin to 6.2 from 13.7 in 9/21.  He has mildly elevated BUN/creatinine, elevated AST and alkaline phosphatase levels, TSH normal, H. pylori IgG negative, normal vitamin B12 levels.  GI consult was placed due to concern for GI bleed and acute anemia.  Patient is also planning to undergo bone marrow biopsy as recommended by oncology.  Patient is accompanied by his son today.  Patient underwent CT abdomen and pelvis without contrast which not reveal any acute intra-abdominal pathology.  He did have left colonic diverticulosis as well as concern for possible diffuse bony metastatic involvement.  Patient is scheduled to undergo CT-guided bone marrow biopsy on 6/20.  NSAIDs: None  Antiplts/Anticoagulants/Anti thrombotics: None  GI Procedures: Patient underwent upper endoscopy and colonoscopy on 05/21/2010 Diagnosis:  Part A: STOMACH COLD BIOPSIES:  - ANTRAL AND OXYNTIC MUCOSA WITH MILD CHRONIC GASTRITIS.  - NEGATIVE FOR H.PYLORI, DYSPLASIA AND MALIGNANCY.  .  Part B: ASCENDING COLON POLYP COLD  BIOPSY:  - TUBULAR ADENOMA.  - NEGATIVE FOR HIGH GRADE DYSPLASIA AND MALIGNANCY.  .  Part C: TRANSVERSE COLON POLYP COLD BIOPSY:  - TUBULAR ADENOMA.  - NEGATIVE FOR HIGH GRADE DYSPLASIA AND MALIGNANCY.  .  Part D: DESCENDING COLON POLYP COLD BIOPSY:  - HYPERPLASTIC POLYP, INFLAMED.  - NEGATIVE FOR DYSPLASIA AND MALIGNANCY.  .  Comment  A Diff Quick stain was examined and is negative for H.pylori.  Controls worked appropriately.   Past Medical History:  Diagnosis Date   Anxiety    Cancer ([place])    skin   Cataract    Depression     Past Surgical History:  Procedure Laterality Date   CATARACT EXTRACTION  2012   polyp removal     TONSILLECTOMY     Current Outpatient Medications:    lisinopril (ZESTRIL) 5 MG tablet, Take by mouth., Disp: , Rfl:    permethrin (ELIMITE) 5 % cream, Apply to affected area once, Disp: 60 g, Rfl: 1   sertraline (ZOLOFT) 50 MG tablet, Take 1 tablet by mouth daily., Disp: , Rfl:    triamcinolone cream (KENALOG) 0.1 %, Apply 1 application topically 2 (two) times daily., Disp: , Rfl:    donepezil (ARICEPT) 5 MG tablet, Take by mouth. (Patient not taking: No sig reported), Disp: , Rfl:    ferrous sulfate 325 (65 FE) MG EC tablet, Take 1 tablet (325 mg total) by mouth 2 (two) times daily with a meal. (Patient not taking: No sig reported), Disp: 30 tablet,  Rfl: 3   levETIRAcetam (KEPPRA) 500 MG tablet, Take 500 mg by mouth 2 (two) times daily. (Patient not taking: Reported on 01/03/2021), Disp: , Rfl:     Family History  Problem Relation Age of Onset   Emphysema Mother      Social History   Tobacco Use   Smoking status: Former    Pack years: 0.00   Smokeless tobacco: Former  Substance Use Topics   Alcohol use: No   Drug use: No    Allergies as of 01/03/2021 - Review Complete 01/03/2021  Allergen Reaction Noted   Amoxicillin Diarrhea 03/16/2014   Aspirin Other (See Comments) 03/16/2014   Lansoprazole Diarrhea 03/16/2014   Sulfa antibiotics  Nausea And Vomiting 01/08/2016    Review of Systems:    All systems reviewed and negative except where noted in HPI.   Physical Exam:  BP 122/77 (BP Location: Left Arm, Patient Position: Sitting, Cuff Size: Normal)   Pulse (!) 111   Temp 97.6 F (36.4 C) (Oral)   Ht 5' 7"  (1.702 m)   Wt 122 lb 8 oz (55.6 kg)   BMI 19.19 kg/m  No LMP for male patient.  General:   Alert, thin built, moderately nourished, pleasant and cooperative in NAD Head:  Normocephalic and atraumatic. Eyes:  Sclera clear, no icterus.   Conjunctiva pink. Ears:  Normal auditory acuity. Nose:  No deformity, discharge, or lesions. Mouth:  No deformity or lesions,oropharynx pink & moist. Neck:  Supple; no masses or thyromegaly. Lungs:  Respirations even and unlabored.  Clear throughout to auscultation.   No wheezes, crackles, or rhonchi. No acute distress. Heart:  Regular rate and rhythm; no murmurs, clicks, rubs, or gallops. Abdomen:  Normal bowel sounds. Soft, non-tender and non-distended without masses, hepatosplenomegaly or hernias noted.  No guarding or rebound tenderness.   Rectal: Not performed Msk:  Symmetrical without gross deformities. Good, equal movement & strength bilaterally. Pulses:  Normal pulses noted. Extremities:  No clubbing or edema.  No cyanosis. Neurologic:  Alert and oriented x3;  grossly normal neurologically. Skin:  Intact without significant lesions or rashes. No jaundice. Psych:  Alert and cooperative. Normal mood and affect.  Imaging Studies: Reviewed  Assessment and Plan:   Taylor Hogan is a 87 y.o. pleasant Caucasian male with history of heavy tobacco use, hypertension is seen in consultation for acute anemia and possible melena.  Patient was also on oral iron until recently with reported history of hard bowel movements.  Unclear if patient's anemia secondary to GI blood loss.  He is scheduled to undergo bone marrow biopsy in 6/20.  If the bone marrow biopsy results are  unremarkable, I have discussed with both patient and his son that we can consider upper endoscopy as well as colonoscopy for further evaluation of anemia.  Although, his iron studies are consistent with anemia of chronic disease at this time  I have advised patient's son to call my office after the bone marrow biopsy results  Follow up as needed   Taylor Darby, MD

## 2021-01-04 ENCOUNTER — Other Ambulatory Visit: Payer: Self-pay | Admitting: Student

## 2021-01-04 ENCOUNTER — Telehealth: Payer: Self-pay | Admitting: Oncology

## 2021-01-04 ENCOUNTER — Other Ambulatory Visit: Payer: Self-pay | Admitting: *Deleted

## 2021-01-04 ENCOUNTER — Telehealth: Payer: Self-pay | Admitting: *Deleted

## 2021-01-04 DIAGNOSIS — D649 Anemia, unspecified: Secondary | ICD-10-CM

## 2021-01-04 NOTE — Telephone Encounter (Signed)
Re: Bone Marrow Biospy  Spoke with son Gerald Stabs who has convinced his son to get the bone marrow biopsy. Unfortunately, he can not come on Monday. He would like to reschedule for Wednesday or later of next week.   He will need to se Dr. Rogue Bussing back 1 week later for results.   Faythe Casa, NP 01/04/2021 12:31 PM

## 2021-01-04 NOTE — Telephone Encounter (Signed)
Patient scheduled for CT guided bone marrow biopsy on 6/23 at 9:00, to arrive at 8:00. Patients son Gerald Stabs notified of appt details and is agreeable to appt at this time. Patient scheduled for f/u on 6/30.

## 2021-01-07 ENCOUNTER — Ambulatory Visit: Admission: RE | Admit: 2021-01-07 | Payer: Medicare Other | Source: Ambulatory Visit

## 2021-01-08 ENCOUNTER — Other Ambulatory Visit: Payer: Self-pay | Admitting: Student

## 2021-01-09 ENCOUNTER — Other Ambulatory Visit: Payer: Self-pay | Admitting: Radiology

## 2021-01-10 ENCOUNTER — Ambulatory Visit
Admission: RE | Admit: 2021-01-10 | Discharge: 2021-01-10 | Disposition: A | Discharge status: Home / Self Care | Payer: Medicare Other | Source: Ambulatory Visit | Attending: Oncology | Admitting: Oncology

## 2021-01-10 ENCOUNTER — Other Ambulatory Visit: Payer: Self-pay

## 2021-01-10 DIAGNOSIS — D6182 Myelophthisis: Secondary | ICD-10-CM | POA: Diagnosis not present

## 2021-01-10 DIAGNOSIS — D649 Anemia, unspecified: Secondary | ICD-10-CM | POA: Diagnosis present

## 2021-01-10 DIAGNOSIS — C7951 Secondary malignant neoplasm of bone: Secondary | ICD-10-CM | POA: Diagnosis not present

## 2021-01-10 DIAGNOSIS — I700 Atherosclerosis of aorta: Secondary | ICD-10-CM | POA: Diagnosis not present

## 2021-01-10 DIAGNOSIS — Z85828 Personal history of other malignant neoplasm of skin: Secondary | ICD-10-CM | POA: Diagnosis not present

## 2021-01-10 DIAGNOSIS — Z87891 Personal history of nicotine dependence: Secondary | ICD-10-CM | POA: Insufficient documentation

## 2021-01-10 DIAGNOSIS — D508 Other iron deficiency anemias: Secondary | ICD-10-CM | POA: Insufficient documentation

## 2021-01-10 DIAGNOSIS — Z79899 Other long term (current) drug therapy: Secondary | ICD-10-CM | POA: Diagnosis not present

## 2021-01-10 DIAGNOSIS — D696 Thrombocytopenia, unspecified: Secondary | ICD-10-CM | POA: Insufficient documentation

## 2021-01-10 LAB — CBC WITH DIFFERENTIAL/PLATELET
Abs Immature Granulocytes: 0.37 10*3/uL — ABNORMAL HIGH (ref 0.00–0.07)
Basophils Absolute: 0.1 10*3/uL (ref 0.0–0.1)
Basophils Relative: 1 %
Eosinophils Absolute: 0.1 10*3/uL (ref 0.0–0.5)
Eosinophils Relative: 3 %
HCT: 21.1 % — ABNORMAL LOW (ref 39.0–52.0)
Hemoglobin: 6.9 g/dL — ABNORMAL LOW (ref 13.0–17.0)
Immature Granulocytes: 9 %
Lymphocytes Relative: 34 %
Lymphs Abs: 1.4 10*3/uL (ref 0.7–4.0)
MCH: 30.8 pg (ref 26.0–34.0)
MCHC: 32.7 g/dL (ref 30.0–36.0)
MCV: 94.2 fL (ref 80.0–100.0)
Monocytes Absolute: 0.3 10*3/uL (ref 0.1–1.0)
Monocytes Relative: 7 %
Neutro Abs: 1.9 10*3/uL (ref 1.7–7.7)
Neutrophils Relative %: 46 %
Platelets: 97 10*3/uL — ABNORMAL LOW (ref 150–400)
RBC: 2.24 MIL/uL — ABNORMAL LOW (ref 4.22–5.81)
RDW: 18.6 % — ABNORMAL HIGH (ref 11.5–15.5)
Smear Review: NORMAL
WBC: 4.1 10*3/uL (ref 4.0–10.5)
nRBC: 9.2 % — ABNORMAL HIGH (ref 0.0–0.2)

## 2021-01-10 LAB — PSA: Prostatic Specific Antigen: 51.09 ng/mL — ABNORMAL HIGH (ref 0.00–4.00)

## 2021-01-10 MED ORDER — FENTANYL CITRATE (PF) 100 MCG/2ML IJ SOLN
INTRAMUSCULAR | Status: AC | PRN
  Administered 2021-01-10 (×2): 25 ug via INTRAVENOUS

## 2021-01-10 MED ORDER — SODIUM CHLORIDE 0.9 % IV SOLN: INTRAVENOUS | Status: DC

## 2021-01-10 MED ORDER — MIDAZOLAM HCL 2 MG/2ML IJ SOLN
INTRAMUSCULAR | Status: AC
  Filled 2021-01-10: qty 2

## 2021-01-10 MED ORDER — MIDAZOLAM HCL 2 MG/2ML IJ SOLN
INTRAMUSCULAR | Status: AC | PRN
  Administered 2021-01-10: 0.5 mg via INTRAVENOUS

## 2021-01-10 MED ORDER — HEPARIN SOD (PORK) LOCK FLUSH 100 UNIT/ML IV SOLN
INTRAVENOUS | Status: AC
  Filled 2021-01-10: qty 5

## 2021-01-10 MED ORDER — FENTANYL CITRATE (PF) 100 MCG/2ML IJ SOLN
INTRAMUSCULAR | Status: AC
  Filled 2021-01-10: qty 2

## 2021-01-10 NOTE — Progress Notes (Signed)
Patient clinically stable post BMB per Dr Kathlene Cote, tolerated well. Awake./alert and oriented post procedure. Son at bedside post procedure with update given. Denies complaints at this time. Received Versed 0.5 mg along with Fentanyl 50 mcg IV for procedure. Report given to Carlynn Spry Rn post procedure.

## 2021-01-10 NOTE — Discharge Instructions (Signed)

## 2021-01-10 NOTE — Procedures (Signed)
Interventional Radiology Procedure Note  Procedure: CT guided bone marrow aspiration and biopsy  Complications: None  EBL: < 10 mL  Findings: Aspirate and core biopsy performed of bone marrow in right iliac bone.  Plan: Bedrest supine x 1 hrs  Nasier Thumm T. Madgie Dhaliwal, M.D Pager:  [phone]   

## 2021-01-10 NOTE — H&P (Signed)
Chief Complaint: Patient was seen in consultation today for bone marrow biopsy at the request of Burns,Jennifer E  Referring Physician(s): Burns,Jennifer E  Patient Status: [facility] - Out-pt  History of Present Illness: Taylor Hogan is a 87 y.o. male with anemia. He received 1 unit packed red blood cells on 12/27/2020 for hemoglobin of 6.7. Labs from 12/26/2020 show an elevated ferritin (3158), elevated LDH (1815) and an elevated reticulocyte count. Peripheral smear showed From blastic changes, thrombocytopenia and rare circulating blast raise concern for a neoplastic process involving the bone marrow. He has a history of CKD. States fatigue, malaise, weakness, weight loss and pain all over, especially in legs.  Past Medical History:  Diagnosis Date   Anxiety    Cancer ([place])    skin   Cataract    Depression     Past Surgical History:  Procedure Laterality Date   CATARACT EXTRACTION  2012   polyp removal     TONSILLECTOMY      Allergies: Amoxicillin, Aspirin, Lansoprazole, and Sulfa antibiotics  Medications: Prior to Admission medications   Medication Sig Start Date End Date Taking? Authorizing Provider  lisinopril (ZESTRIL) 5 MG tablet Take by mouth. 11/22/20  Yes [provider]  permethrin (ELIMITE) 5 % cream Apply to affected area once 02/02/17  Yes Frederich Cha, MD  sertraline (ZOLOFT) 50 MG tablet Take 1 tablet by mouth daily. 10/15/20  Yes [provider]  triamcinolone cream (KENALOG) 0.1 % Apply 1 application topically 2 (two) times daily.   Yes [provider]  donepezil (ARICEPT) 5 MG tablet Take by mouth. Patient not taking: No sig reported 12/24/20 12/24/21  [provider]  ferrous sulfate 325 (65 FE) MG EC tablet Take 1 tablet (325 mg total) by mouth 2 (two) times daily with a meal. Patient not taking: No sig reported 12/26/20   Jacquelin Hawking, NP  levETIRAcetam (KEPPRA) 500 MG tablet Take 500 mg by mouth 2 (two) times  daily. Patient not taking: Reported on 01/03/2021    [provider]     Family History  Problem Relation Age of Onset   Emphysema Mother     Social History   Socioeconomic History   Marital status: Married    Spouse name: Not on file   Number of children: Not on file   Years of education: Not on file   Highest education level: Not on file  Occupational History   Not on file  Tobacco Use   Smoking status: Former    Pack years: 0.00   Smokeless tobacco: Former  Substance and Sexual Activity   Alcohol use: No   Drug use: No   Sexual activity: Not Currently  Other Topics Concern   Not on file  Social History Narrative   Not on file   Social Determinants of Health   Financial Resource Strain: Not on file  Food Insecurity: Not on file  Transportation Needs: Not on file  Physical Activity: Not on file  Stress: Not on file  Social Connections: Not on file    ECOG Status: 2 - Symptomatic, <50% confined to bed  Review of Systems: A 12 point ROS discussed and pertinent positives are indicated in the HPI above.  All other systems are negative.  Review of Systems  Constitutional:  Positive for activity change, fatigue and unexpected weight change.  Respiratory: Negative.    Cardiovascular: Negative.   Gastrointestinal: Negative.   Genitourinary: Negative.   Musculoskeletal:  Positive for arthralgias and  myalgias.  Skin: Negative.   Neurological:  Positive for weakness. Negative for syncope, speech difficulty and numbness.   Vital Signs: BP 113/67   Pulse (!) 106   Temp 98.2 F (36.8 C) (Oral)   Resp 20   Ht 5' 7"  (1.702 m)   Wt 55.3 kg   SpO2 100%   BMI 19.11 kg/m   Physical Exam Vitals reviewed.  Constitutional:      General: He is not in acute distress.    Appearance: He is not toxic-appearing.  HENT:     Head: Normocephalic and atraumatic.  Cardiovascular:     Rate and Rhythm: Normal rate and regular rhythm.     Heart sounds: Murmur heard.     No friction rub. No gallop.     Comments: II/VI SEM Pulmonary:     Effort: Pulmonary effort is normal. No respiratory distress.     Breath sounds: Normal breath sounds. No stridor. No wheezing, rhonchi or rales.  Abdominal:     General: Bowel sounds are normal. There is no distension.     Palpations: Abdomen is soft. There is no mass.     Tenderness: There is no abdominal tenderness. There is no guarding or rebound.  Musculoskeletal:        General: No swelling.     Cervical back: Neck supple. No tenderness.  Skin:    General: Skin is warm and dry.  Neurological:     General: No focal deficit present.     Mental Status: He is alert and oriented to person, place, and time.    Imaging: CT Abdomen Pelvis Wo Contrast  Result Date: 01/03/2021 CLINICAL DATA:  GI bleed with nausea EXAM: CT ABDOMEN AND PELVIS WITHOUT CONTRAST TECHNIQUE: Multidetector CT imaging of the abdomen and pelvis was performed following the standard protocol without IV contrast. COMPARISON:  04/13/2013 FINDINGS: Lower chest: Unremarkable Hepatobiliary: Scattered hypoattenuating lesions of varying size in the liver are similar to prior, compatible with cyst. Some of these lesions are too small to characterize but likely benign. Gallbladder is decompressed. No intrahepatic or extrahepatic biliary dilation. Pancreas: No focal mass lesion. No dilatation of the main duct. No intraparenchymal cyst. No peripancreatic edema. Spleen: No splenomegaly. No focal mass lesion. Adrenals/Urinary Tract: No adrenal nodule or mass. 1 mm nonobstructing stone noted lower pole right kidney. Punctate nonobstructing stone noted interpolar left kidney. No evidence for hydroureter. The urinary bladder appears normal for the degree of distention. Stomach/Bowel: Stomach is unremarkable. No gastric wall thickening. No evidence of outlet obstruction. Duodenum is normally positioned as is the ligament of Treitz. Duodenal diverticulum noted. No small bowel  wall thickening. No small bowel dilatation. The terminal ileum is normal. The appendix is not well visualized, but there is no edema or inflammation in the region of the cecum. No gross colonic mass. No colonic wall thickening. Diverticular changes are noted in the left colon without evidence of diverticulitis. Mild wall thickening in the mid sigmoid colon (image 51/2) is likely related to the diverticular disease. Vascular/Lymphatic: There is abdominal aortic atherosclerosis without aneurysm. There is no gastrohepatic or hepatoduodenal ligament lymphadenopathy. No retroperitoneal or mesenteric lymphadenopathy. No pelvic sidewall lymphadenopathy. Reproductive: The prostate gland and seminal vesicles are unremarkable. Other: No intraperitoneal free fluid. Musculoskeletal: Bones are markedly heterogeneous, a finding new since the prior study. This could be related to chronic renal disease although diffuse bony metastatic involvement cannot be excluded. IMPRESSION: 1. No acute findings in the abdomen or pelvis. 2. No definite to explain  the patient's history of GI bleeding. There is left diverticulosis with mild wall thickening in the mid sigmoid colon likely related to the underlying diverticular disease. Given the history of GI bleeding, colonoscopy may be warranted to confirm. 3. Markedly heterogeneous appearance of the bony anatomy. This could be related to chronic renal disease although diffuse bony metastatic involvement cannot be excluded. 4. Aortic Atherosclerosis (ICD10-I70.0). Electronically Signed   By: Misty Stanley M.D.   On: 01/03/2021 13:29    Labs:  CBC: Recent Labs    12/26/20 1548 01/02/21 0955  WBC 5.7 5.2  HGB 6.7* 8.0*  HCT 21.9* 25.0*  PLT 121* 101*    COAGS: No results for input(s): INR, APTT in the last 8760 hours.  BMP: Recent Labs    12/26/20 1548 01/02/21 0955  NA 140 142  K 4.2 4.5  CL 108 109  CO2 21* 24  GLUCOSE 107* 105*  BUN 29* 24*  CALCIUM 8.4* 8.6*   CREATININE 1.54* 1.44*  GFRNONAA 43* 47*    LIVER FUNCTION TESTS: Recent Labs    12/26/20 1548 01/02/21 0955  BILITOT 0.9 0.6  AST 97* 42*  ALT 20 16  ALKPHOS 804* 767*  PROT 7.2 7.2  ALBUMIN 3.5 3.5     Assessment and Plan:  For CT guided bone marrow biopsy today for further workup of hematologic abnormalities. Risks and benefits of bone marrow biopsy was discussed with the patient and his son including, but not limited to bleeding, infection, damage to adjacent structures or low yield requiring additional tests. All of the questions were answered and there is agreement to proceed. Consent signed and in chart.   Thank you for this interesting consult.  I greatly enjoyed meeting Taylor Hogan and look forward to participating in their care.  A copy of this report was sent to the requesting provider on this date.  Electronically Signed: Azzie Roup, MD 01/10/2021, 9:18 AM    I spent a total of  15 Minutes in face to face in clinical consultation, greater than 50% of which was counseling/coordinating care for bone marrow biopsy.

## 2021-01-14 LAB — SURGICAL PATHOLOGY

## 2021-01-17 ENCOUNTER — Inpatient Hospital Stay: Payer: Medicare Other

## 2021-01-17 ENCOUNTER — Other Ambulatory Visit: Payer: Self-pay

## 2021-01-17 ENCOUNTER — Ambulatory Visit: Payer: Medicare Other

## 2021-01-17 ENCOUNTER — Inpatient Hospital Stay (HOSPITAL_BASED_OUTPATIENT_CLINIC_OR_DEPARTMENT_OTHER): Payer: Medicare Other | Admitting: Internal Medicine

## 2021-01-17 ENCOUNTER — Other Ambulatory Visit: Payer: Self-pay | Admitting: *Deleted

## 2021-01-17 DIAGNOSIS — R6889 Other general symptoms and signs: Secondary | ICD-10-CM

## 2021-01-17 DIAGNOSIS — D649 Anemia, unspecified: Secondary | ICD-10-CM

## 2021-01-17 DIAGNOSIS — C61 Malignant neoplasm of prostate: Secondary | ICD-10-CM | POA: Diagnosis not present

## 2021-01-17 LAB — CBC WITH DIFFERENTIAL/PLATELET
Abs Immature Granulocytes: 0.45 10*3/uL — ABNORMAL HIGH (ref 0.00–0.07)
Basophils Absolute: 0.1 10*3/uL (ref 0.0–0.1)
Basophils Relative: 1 %
Eosinophils Absolute: 0.1 10*3/uL (ref 0.0–0.5)
Eosinophils Relative: 2 %
HCT: 21.7 % — ABNORMAL LOW (ref 39.0–52.0)
Hemoglobin: 6.7 g/dL — ABNORMAL LOW (ref 13.0–17.0)
Immature Granulocytes: 9 %
Lymphocytes Relative: 29 %
Lymphs Abs: 1.5 10*3/uL (ref 0.7–4.0)
MCH: 30.2 pg (ref 26.0–34.0)
MCHC: 30.9 g/dL (ref 30.0–36.0)
MCV: 97.7 fL (ref 80.0–100.0)
Monocytes Absolute: 0.5 10*3/uL (ref 0.1–1.0)
Monocytes Relative: 9 %
Neutro Abs: 2.6 10*3/uL (ref 1.7–7.7)
Neutrophils Relative %: 50 %
Platelets: 95 10*3/uL — ABNORMAL LOW (ref 150–400)
RBC: 2.22 MIL/uL — ABNORMAL LOW (ref 4.22–5.81)
RDW: 18.9 % — ABNORMAL HIGH (ref 11.5–15.5)
Smear Review: NORMAL
WBC: 5.3 10*3/uL (ref 4.0–10.5)
nRBC: 7.7 % — ABNORMAL HIGH (ref 0.0–0.2)

## 2021-01-17 LAB — BASIC METABOLIC PANEL
Anion gap: 13 (ref 5–15)
BUN: 49 mg/dL — ABNORMAL HIGH (ref 8–23)
CO2: 20 mmol/L — ABNORMAL LOW (ref 22–32)
Calcium: 8.8 mg/dL — ABNORMAL LOW (ref 8.9–10.3)
Chloride: 105 mmol/L (ref 98–111)
Creatinine, Ser: 1.65 mg/dL — ABNORMAL HIGH (ref 0.61–1.24)
GFR, Estimated: 40 mL/min — ABNORMAL LOW (ref >60)
Glucose, Bld: 121 mg/dL — ABNORMAL HIGH (ref 70–99)
Potassium: 4.5 mmol/L (ref 3.5–5.1)
Sodium: 138 mmol/L (ref 135–145)

## 2021-01-17 LAB — SAMPLE TO BLOOD BANK

## 2021-01-17 LAB — PREPARE RBC (CROSSMATCH)

## 2021-01-17 LAB — LACTATE DEHYDROGENASE: LDH: 1653 U/L — ABNORMAL HIGH (ref 98–192)

## 2021-01-17 MED ORDER — ACETAMINOPHEN 325 MG PO TABS
650.0000 mg | ORAL_TABLET | Freq: Once | ORAL | Status: AC
Start: 2021-01-17 — End: 2021-01-17
  Administered 2021-01-17: 650 mg via ORAL
  Filled 2021-01-17: qty 2

## 2021-01-17 MED ORDER — DIPHENHYDRAMINE HCL 25 MG PO CAPS
25.0000 mg | ORAL_CAPSULE | Freq: Once | ORAL | Status: AC
  Administered 2021-01-17: 25 mg via ORAL
  Filled 2021-01-17: qty 1

## 2021-01-17 MED ORDER — SODIUM CHLORIDE 0.9% IV SOLUTION
250.0000 mL | Freq: Once | INTRAVENOUS | Status: AC
  Administered 2021-01-17: 250 mL via INTRAVENOUS
  Filled 2021-01-17: qty 250

## 2021-01-17 MED ORDER — ACETAMINOPHEN-CODEINE #3 300-30 MG PO TABS: 1.0000 | ORAL_TABLET | Freq: Three times a day (TID) | ORAL | 0 refills | Status: AC | PRN

## 2021-01-17 MED ORDER — SODIUM CHLORIDE 0.9% FLUSH
10.0000 mL | INTRAVENOUS | Status: DC | PRN
Start: 2021-01-17 — End: 2021-01-17
  Filled 2021-01-17: qty 10

## 2021-01-17 MED ORDER — PREDNISONE 20 MG PO TABS: 20.0000 mg | ORAL_TABLET | Freq: Every day | ORAL | 1 refills | Status: DC

## 2021-01-17 NOTE — Progress Notes (Signed)
[place] NOTE  Patient Care Team: Gauger, Victoriano Lain, NP as PCP - General (Internal Medicine)  CHIEF COMPLAINTS/PURPOSE OF CONSULTATION:  Metastatic prostate cancer  #  Oncology History Overview Note  July 2022- Metastatic prostate cancer to the bone/bone marrow-stage IV; castrate sensitive.  START FIRMAGON.  #Pancytopenia-severe anemia; s/p bone marrow biopsy [June 2022-] secondary to bone marrow infiltration with prostate cancer.  No obvious presence of any MDS/leukemia.       Prostate cancer metastatic to multiple sites [facility])  01/17/2021 Initial Diagnosis   Prostate cancer metastatic to multiple sites [facility])      HISTORY OF PRESENTING ILLNESS:  Taylor Hogan 87 y.o.  male with fairly new onset of worsening anemia/pancytopenia is here for follow-up/review results of this bone marrow biopsy.  Patient was recently evaluated in the clinic given his significant anemia/thrombocytopenia patient underwent a bone marrow biopsy.  Complains of poor appetite.  Loss of weight.  Extreme fatigue.  Joint pains back pain.  No nausea no vomiting no headaches.  No blood in stools or black-colored stools.  Overall feels poorly.  Review of Systems  Constitutional:  Positive for malaise/fatigue and weight loss. Negative for chills, diaphoresis and fever.  HENT:  Negative for nosebleeds and sore throat.   Eyes:  Negative for double vision.  Respiratory:  Positive for shortness of breath. Negative for cough, hemoptysis, sputum production and wheezing.   Cardiovascular:  Negative for chest pain, palpitations, orthopnea and leg swelling.  Gastrointestinal:  Negative for abdominal pain, blood in stool, constipation, diarrhea, heartburn, melena, nausea and vomiting.  Genitourinary:  Negative for dysuria, frequency and urgency.  Musculoskeletal:  Positive for back pain, joint pain, myalgias and neck pain.  Skin: Negative.  Negative for itching and rash.  Neurological:   Negative for dizziness, tingling, focal weakness, weakness and headaches.  Endo/Heme/Allergies:  Does not bruise/bleed easily.  Psychiatric/Behavioral:  Negative for depression. The patient is not nervous/anxious and does not have insomnia.     MEDICAL HISTORY:  Past Medical History:  Diagnosis Date   Anxiety    Cancer ([place])    skin   Cataract    Depression     SURGICAL HISTORY: Past Surgical History:  Procedure Laterality Date   CATARACT EXTRACTION  2012   polyp removal     TONSILLECTOMY      SOCIAL HISTORY: Social History   Socioeconomic History   Marital status: Married    Spouse name: Not on file   Number of children: Not on file   Years of education: Not on file   Highest education level: Not on file  Occupational History   Not on file  Tobacco Use   Smoking status: Former    Pack years: 0.00   Smokeless tobacco: Former  Substance and Sexual Activity   Alcohol use: No   Drug use: No   Sexual activity: Not Currently  Other Topics Concern   Not on file  Social History Narrative   Not on file   Social Determinants of Health   Financial Resource Strain: Not on file  Food Insecurity: Not on file  Transportation Needs: Not on file  Physical Activity: Not on file  Stress: Not on file  Social Connections: Not on file  Intimate Partner Violence: Not on file    FAMILY HISTORY: Family History  Problem Relation Age of Onset   Emphysema Mother     ALLERGIES:  is allergic to amoxicillin, lansoprazole, and sulfa antibiotics.  MEDICATIONS:  Current  Outpatient Medications  Medication Sig Dispense Refill   acetaminophen-codeine (TYLENOL #3) 300-30 MG tablet Take 1 tablet by mouth every 8 (eight) hours as needed for moderate pain. 60 tablet 0   lisinopril (ZESTRIL) 5 MG tablet Take 5 mg by mouth daily.     predniSONE (DELTASONE) 20 MG tablet Take 1 tablet (20 mg total) by mouth daily with breakfast. Once a day with food x 10 days. 30 tablet 1   sertraline  (ZOLOFT) 50 MG tablet Take 1 tablet by mouth daily.     No current facility-administered medications for this visit.      Marland Kitchen  PHYSICAL EXAMINATION: ECOG PERFORMANCE STATUS: 2 - Symptomatic, <50% confined to bed  Vitals:   01/17/21 1013  BP: (!) 96/57  Pulse: (!) 110  Resp: 20  Temp: 97.6 F (36.4 C)   Filed Weights   01/17/21 1013  Weight: 124 lb (56.2 kg)    Physical Exam Vitals and nursing note reviewed.  Constitutional:      Comments: Ambulating: With a wheelchair Accompanied: By son  HENT:     Head: Normocephalic and atraumatic.     Mouth/Throat:     Pharynx: Oropharynx is clear.  Eyes:     Extraocular Movements: Extraocular movements intact.     Pupils: Pupils are equal, round, and reactive to light.  Cardiovascular:     Rate and Rhythm: Normal rate and regular rhythm.  Pulmonary:     Comments: Decreased breath sounds bilaterally.  Abdominal:     Palpations: Abdomen is soft.  Musculoskeletal:        General: Normal range of motion.     Cervical back: Normal range of motion.  Skin:    General: Skin is warm.  Neurological:     General: No focal deficit present.     Mental Status: He is alert and oriented to person, place, and time.  Psychiatric:        Behavior: Behavior normal.        Judgment: Judgment normal.    LABORATORY DATA:  I have reviewed the data as listed Lab Results  Component Value Date   WBC 5.3 01/17/2021   HGB 6.7 (L) 01/17/2021   HCT 21.7 (L) 01/17/2021   MCV 97.7 01/17/2021   PLT 95 (L) 01/17/2021   Recent Labs    12/26/20 1548 01/02/21 0955 01/17/21 0935  NA 140 142 138  K 4.2 4.5 4.5  CL 108 109 105  CO2 21* 24 20*  GLUCOSE 107* 105* 121*  BUN 29* 24* 49*  CREATININE 1.54* 1.44* 1.65*  CALCIUM 8.4* 8.6* 8.8*  GFRNONAA 43* 47* 40*  PROT 7.2 7.2  --   ALBUMIN 3.5 3.5  --   AST 97* 42*  --   ALT 20 16  --   ALKPHOS 804* 767*  --   BILITOT 0.9 0.6  --     RADIOGRAPHIC STUDIES: I have personally reviewed the  radiological images as listed and agreed with the findings in the report. CT Abdomen Pelvis Wo Contrast  Result Date: 01/03/2021 CLINICAL DATA:  GI bleed with nausea EXAM: CT ABDOMEN AND PELVIS WITHOUT CONTRAST TECHNIQUE: Multidetector CT imaging of the abdomen and pelvis was performed following the standard protocol without IV contrast. COMPARISON:  04/13/2013 FINDINGS: Lower chest: Unremarkable Hepatobiliary: Scattered hypoattenuating lesions of varying size in the liver are similar to prior, compatible with cyst. Some of these lesions are too small to characterize but likely benign. Gallbladder is decompressed. No intrahepatic or extrahepatic  biliary dilation. Pancreas: No focal mass lesion. No dilatation of the main duct. No intraparenchymal cyst. No peripancreatic edema. Spleen: No splenomegaly. No focal mass lesion. Adrenals/Urinary Tract: No adrenal nodule or mass. 1 mm nonobstructing stone noted lower pole right kidney. Punctate nonobstructing stone noted interpolar left kidney. No evidence for hydroureter. The urinary bladder appears normal for the degree of distention. Stomach/Bowel: Stomach is unremarkable. No gastric wall thickening. No evidence of outlet obstruction. Duodenum is normally positioned as is the ligament of Treitz. Duodenal diverticulum noted. No small bowel wall thickening. No small bowel dilatation. The terminal ileum is normal. The appendix is not well visualized, but there is no edema or inflammation in the region of the cecum. No gross colonic mass. No colonic wall thickening. Diverticular changes are noted in the left colon without evidence of diverticulitis. Mild wall thickening in the mid sigmoid colon (image 51/2) is likely related to the diverticular disease. Vascular/Lymphatic: There is abdominal aortic atherosclerosis without aneurysm. There is no gastrohepatic or hepatoduodenal ligament lymphadenopathy. No retroperitoneal or mesenteric lymphadenopathy. No pelvic sidewall  lymphadenopathy. Reproductive: The prostate gland and seminal vesicles are unremarkable. Other: No intraperitoneal free fluid. Musculoskeletal: Bones are markedly heterogeneous, a finding new since the prior study. This could be related to chronic renal disease although diffuse bony metastatic involvement cannot be excluded. IMPRESSION: 1. No acute findings in the abdomen or pelvis. 2. No definite to explain the patient's history of GI bleeding. There is left diverticulosis with mild wall thickening in the mid sigmoid colon likely related to the underlying diverticular disease. Given the history of GI bleeding, colonoscopy may be warranted to confirm. 3. Markedly heterogeneous appearance of the bony anatomy. This could be related to chronic renal disease although diffuse bony metastatic involvement cannot be excluded. 4. Aortic Atherosclerosis (ICD10-I70.0). Electronically Signed   By: Misty Stanley M.D.   On: 01/03/2021 13:29   CT BONE MARROW BIOPSY & ASPIRATION  Result Date: 01/10/2021 CLINICAL DATA:  Anemia and thrombocytopenia. EXAM: CT GUIDED BONE MARROW ASPIRATION AND BIOPSY ANESTHESIA/SEDATION: Versed 1.0 mg IV, Fentanyl 50 mcg IV Total Moderate Sedation Time:   14 minutes. The patient's level of consciousness and physiologic status were continuously monitored during the procedure by Radiology nursing. PROCEDURE: The procedure risks, benefits, and alternatives were explained to the patient. Questions regarding the procedure were encouraged and answered. The patient understands and consents to the procedure. A time out was performed prior to initiating the procedure. The right gluteal region was prepped with chlorhexidine. Sterile gown and sterile gloves were used for the procedure. Local anesthesia was provided with 1% Lidocaine. Under CT guidance, an 11 gauge On Control bone cutting needle was advanced from a posterior approach into the right iliac bone. Needle positioning was confirmed with CT. Initial  non heparinized and heparinized aspirate samples were obtained of bone marrow. Core biopsy was performed via the On Control drill needle. COMPLICATIONS: None FINDINGS: Initial imaging of the lower lumbar spine, sacrum and bony pelvis demonstrates diffuse abnormal sclerosis of all of the visualized bony structures. Findings are suggestive of either diffuse sclerotic metastatic disease or a diffuse bone marrow process. Given sclerosis, metastatic prostate carcinoma would be a concern. Initial aspiration yielded very limited return of liquified marrow. An intact core biopsy sample was able to be obtained. IMPRESSION: 1. CT guided bone marrow biopsy of right posterior iliac bone with both aspirate and core samples obtained. Aspirate sample was very limited with a nearly dry tap. 2. Diffuse abnormal sclerosis of all bony  structures visualized including the lower lumbar spine, sacrum and bilateral iliac bones. Findings may be consistent with diffuse sclerotic metastatic disease or a diffuse bone marrow process. Metastatic prostate carcinoma would be of concern and a PSA level was drawn following the biopsy procedure. Electronically Signed   By: Aletta Edouard M.D.   On: 01/10/2021 15:28    ASSESSMENT & PLAN:   Prostate cancer metastatic to multiple sites [facility]) #Metastatic prostate cancer to the bone/bone marrow-stage IV; castrate sensitive.  Discussed the role of ADT treatment metastatic prostate cancer.  Discussed the potential side effects including but not limited to hot flashes/loss of libido etc.  However at this time benefits of the treatment outweigh the risk.  Proceed with Firmagon loading today.  Patient is a poor candidate for systemic chemotherapy.  Potential Zytiga/ADT if performance status improves.  #Pancytopenia-severe anemia; secondary to bone marrow infiltration with prostate cancer.  Reviewed the bone marrow biopsy in length.  No obvious presence of any MDS/leukemia.  Hemoglobin today 6.7% with  PRBC blood transfusion.  #Arthritis/bone pain likely secondary to involvement of the prostate cancer-prednisone/see below  #Poor appetite/lost weight-proceed with prednisone 20 mg daily.  Prescription sent.  Awaiting nutrition evaluation today.  # Palliative care: discuss  # DISPOSITION: # Blood transfusion today # firmagon [in [place]] ASAP- 1st time dose  # on July 12th- [in [place]]- MD; labs- cbc/cmp; Hold tube; 1 possible PRBC transfusion- Dr.B  # 40 minutes face-to-face with the patient discussing the above plan of care; more than 50% of time spent on prognosis/ natural history; counseling and coordination.    All questions were answered. The patient knows to call the clinic with any problems, questions or concerns.       Cammie Sickle, MD 01/21/2021 5:44 PM

## 2021-01-17 NOTE — Progress Notes (Signed)
Nutrition Follow-up:  Patient with anemia.  Followed by Dr B. Noted bone marrow biopsy results. MD note from today not available at this time. Patient receiving blood today  Met with patient in infusion.  Patient with cognitive decline.  Says that he tried the shakes but can't remember which one he liked, "the name brand ones were pretty good."  Says that he drinking 1 a day.  Likes his chocolate milk and pepsi.  Has a refrigerator full of drinks and cabinet full of snacks and food to eat in his basement where he and his dog stay.  Wife prepares meals. Had kielbasa and vegetables last night per patient.  Likes peanut butter nabs.      Medications: reviewed  Labs: reviewed  Anthropometrics:   Weight 124 lb today increased  122 lb 9 oz on 6/15  UBW of 133 lb   NUTRITION DIAGNOSIS: Inadequate oral intake improving with weight gain    INTERVENTION:  Recommend patient increase oral nutrition supplement to BID.  Patient says he thinks he can do that.  Continue to encourage high calorie, high protein foods and snacks.  Patient may benefit from trial of appetite stimulant.      MONITORING, EVALUATION, GOAL: weight trends, intake   NEXT VISIT: ~3 weeks by phone  Carolle Ishii B. Zenia Resides, [place], [place] Registered Dietitian [phone] (mobile)

## 2021-01-17 NOTE — Patient Instructions (Signed)
[facility]  Discharge Instructions: Thank you for choosing [facility] to provide your oncology and hematology care.  If you have a lab appointment with the [facility], please go directly to the [facility] and check in at the registration area.  Wear comfortable clothing and clothing appropriate for easy access to any Portacath or PICC line.   We strive to give you quality time with your provider. You may need to reschedule your appointment if you arrive late (15 or more minutes).  Arriving late affects you and other patients whose appointments are after yours.  Also, if you miss three or more appointments without notifying the office, you may be dismissed from the clinic at the provider's discretion.      For prescription refill requests, have your pharmacy contact our office and allow 72 hours for refills to be completed.    Today you received the following chemotherapy and/or immunotherapy agents Blood      To help prevent nausea and vomiting after your treatment, we encourage you to take your nausea medication as directed.  BELOW ARE SYMPTOMS THAT SHOULD BE REPORTED IMMEDIATELY: *FEVER GREATER THAN 100.4 F (38 C) OR HIGHER *CHILLS OR SWEATING *NAUSEA AND VOMITING THAT IS NOT CONTROLLED WITH YOUR NAUSEA MEDICATION *UNUSUAL SHORTNESS OF BREATH *UNUSUAL BRUISING OR BLEEDING *URINARY PROBLEMS (pain or burning when urinating, or frequent urination) *BOWEL PROBLEMS (unusual diarrhea, constipation, pain near the anus) TENDERNESS IN MOUTH AND THROAT WITH OR WITHOUT PRESENCE OF ULCERS (sore throat, sores in mouth, or a toothache) UNUSUAL RASH, SWELLING OR PAIN  UNUSUAL VAGINAL DISCHARGE OR ITCHING   Items with * indicate a potential emergency and should be followed up as soon as possible or go to the Emergency Department if any problems should occur.  Please show the CHEMOTHERAPY ALERT CARD or IMMUNOTHERAPY ALERT CARD at check-in to  the Emergency Department and triage nurse.  Should you have questions after your visit or need to cancel or reschedule your appointment, please contact [facility]  [phone] and follow the prompts.  Office hours are 8:00 a.m. to 4:30 p.m. Monday - Friday. Please note that voicemails left after 4:00 p.m. may not be returned until the following business day.  We are closed weekends and major holidays. You have access to a nurse at all times for urgent questions. Please call the main number to the clinic [phone] and follow the prompts.  For any non-urgent questions, you may also contact your provider using MyChart. We now offer e-Visits for anyone 49 and older to request care online for non-urgent symptoms. For details visit [email or web address].   Also download the MyChart app! Go to the app store, search "MyChart", open the app, select [facility], and log in with your MyChart username and password.  Due to Covid, a mask is required upon entering the hospital/clinic. If you do not have a mask, one will be given to you upon arrival. For doctor visits, patients may have 1 support person aged 47 or older with them. For treatment visits, patients cannot have anyone with them due to current Covid guidelines and our immunocompromised population.   Blood Transfusion, Adult, Care After This sheet gives you information about how to care for yourself after your procedure. Your doctor may also give you more specific instructions. If youhave problems or questions, contact your doctor. What can I expect after the procedure? After the procedure, it is common to have: Bruising and soreness at  the IV site. A fever or chills on the day of the procedure. This may be your body's response to the new blood cells received. A headache. Follow these instructions at home: Insertion site care     Follow instructions from your doctor about how to take care of your insertion  site. This is where an IV tube was put into your vein. Make sure you: Wash your hands with soap and water before and after you change your bandage (dressing). If you cannot use soap and water, use hand sanitizer. Change your bandage as told by your doctor. Check your insertion site every day for signs of infection. Check for: Redness, swelling, or pain. Bleeding from the site. Warmth. Pus or a bad smell. General instructions Take over-the-counter and prescription medicines only as told by your doctor. Rest as told by your doctor. Go back to your normal activities as told by your doctor. Keep all follow-up visits as told by your doctor. This is important. Contact a doctor if: You have itching or red, swollen areas of skin (hives). You feel worried or nervous (anxious). You feel weak after doing your normal activities. You have redness, swelling, warmth, or pain around the insertion site. You have blood coming from the insertion site, and the blood does not stop with pressure. You have pus or a bad smell coming from the insertion site. Get help right away if: You have signs of a serious reaction. This may be coming from an allergy or the body's defense system (immune system). Signs include: Trouble breathing or shortness of breath. Swelling of the face or feeling warm (flushed). Fever or chills. Head, chest, or back pain. Dark pee (urine) or blood in the pee. Widespread rash. Fast heartbeat. Feeling dizzy or light-headed. You may receive your blood transfusion in an outpatient setting. If so, youwill be told whom to contact to report any reactions. These symptoms may be an emergency. Do not wait to see if the symptoms will go away. Get medical help right away. Call your local emergency services (911 in the [place] Do not drive yourself to the hospital. Summary Bruising and soreness at the IV site are common. Check your insertion site every day for signs of infection. Rest as told by  your doctor. Go back to your normal activities as told by your doctor. Get help right away if you have signs of a serious reaction. This information is not intended to replace advice given to you by your health care provider. Make sure you discuss any questions you have with your healthcare provider. Document Revised: 12/30/2018 Document Reviewed: 12/30/2018 Elsevier Patient Education  [place].

## 2021-01-17 NOTE — Assessment & Plan Note (Addendum)
#  Metastatic prostate cancer to the bone/bone marrow-stage IV; castrate sensitive.  Discussed the role of ADT treatment metastatic prostate cancer.  Discussed the potential side effects including but not limited to hot flashes/loss of libido etc.  However at this time benefits of the treatment outweigh the risk.  Proceed with Firmagon loading today.  Patient is a poor candidate for systemic chemotherapy.  Potential Zytiga/ADT if performance status improves.  #Pancytopenia-severe anemia; secondary to bone marrow infiltration with prostate cancer.  Reviewed the bone marrow biopsy in length.  No obvious presence of any MDS/leukemia.  Hemoglobin today 6.7% with PRBC blood transfusion.  #Arthritis/bone pain likely secondary to involvement of the prostate cancer-prednisone/see below  #Poor appetite/lost weight-proceed with prednisone 20 mg daily.  Prescription sent.  Awaiting nutrition evaluation today.  # Palliative care: discuss  # DISPOSITION: # Blood transfusion today # firmagon [in [place]] ASAP- 1st time dose  # on July 12th- [in [place]]- MD; labs- cbc/cmp; Hold tube; 1 possible PRBC transfusion- Dr.B  # 40 minutes face-to-face with the patient discussing the above plan of care; more than 50% of time spent on prognosis/ natural history; counseling and coordination.

## 2021-01-18 ENCOUNTER — Encounter (HOSPITAL_COMMUNITY): Payer: Self-pay | Admitting: Oncology

## 2021-01-18 LAB — TYPE AND SCREEN
ABO/RH(D): O NEG
Antibody Screen: NEGATIVE
Unit division: 0

## 2021-01-18 LAB — BPAM RBC
Blood Product Expiration Date: 202208052359
ISSUE DATE / TIME: 202206301239
Unit Type and Rh: 5100

## 2021-01-21 ENCOUNTER — Encounter: Payer: Self-pay | Admitting: Internal Medicine

## 2021-01-22 ENCOUNTER — Other Ambulatory Visit: Payer: Self-pay

## 2021-01-22 ENCOUNTER — Inpatient Hospital Stay: Payer: Medicare Other | Attending: Internal Medicine

## 2021-01-22 VITALS — BP 101/64 | HR 103 | Temp 97.2°F | Resp 18

## 2021-01-22 DIAGNOSIS — Z8673 Personal history of transient ischemic attack (TIA), and cerebral infarction without residual deficits: Secondary | ICD-10-CM | POA: Insufficient documentation

## 2021-01-22 DIAGNOSIS — Z87891 Personal history of nicotine dependence: Secondary | ICD-10-CM | POA: Insufficient documentation

## 2021-01-22 DIAGNOSIS — R634 Abnormal weight loss: Secondary | ICD-10-CM | POA: Diagnosis not present

## 2021-01-22 DIAGNOSIS — C7951 Secondary malignant neoplasm of bone: Secondary | ICD-10-CM | POA: Diagnosis not present

## 2021-01-22 DIAGNOSIS — R945 Abnormal results of liver function studies: Secondary | ICD-10-CM | POA: Diagnosis not present

## 2021-01-22 DIAGNOSIS — D649 Anemia, unspecified: Secondary | ICD-10-CM | POA: Diagnosis not present

## 2021-01-22 DIAGNOSIS — R569 Unspecified convulsions: Secondary | ICD-10-CM | POA: Diagnosis not present

## 2021-01-22 DIAGNOSIS — N183 Chronic kidney disease, stage 3 unspecified: Secondary | ICD-10-CM | POA: Insufficient documentation

## 2021-01-22 DIAGNOSIS — R5383 Other fatigue: Secondary | ICD-10-CM | POA: Insufficient documentation

## 2021-01-22 DIAGNOSIS — R112 Nausea with vomiting, unspecified: Secondary | ICD-10-CM | POA: Insufficient documentation

## 2021-01-22 DIAGNOSIS — D61818 Other pancytopenia: Secondary | ICD-10-CM | POA: Diagnosis not present

## 2021-01-22 DIAGNOSIS — C61 Malignant neoplasm of prostate: Secondary | ICD-10-CM | POA: Diagnosis present

## 2021-01-22 DIAGNOSIS — R630 Anorexia: Secondary | ICD-10-CM | POA: Diagnosis not present

## 2021-01-22 DIAGNOSIS — Z86010 Personal history of colonic polyps: Secondary | ICD-10-CM | POA: Diagnosis not present

## 2021-01-22 DIAGNOSIS — M1990 Unspecified osteoarthritis, unspecified site: Secondary | ICD-10-CM | POA: Diagnosis not present

## 2021-01-22 DIAGNOSIS — E785 Hyperlipidemia, unspecified: Secondary | ICD-10-CM | POA: Diagnosis not present

## 2021-01-22 DIAGNOSIS — K921 Melena: Secondary | ICD-10-CM | POA: Diagnosis not present

## 2021-01-22 MED ORDER — DEGARELIX ACETATE(240 MG DOSE) 120 MG/VIAL ~~LOC~~ SOLR
240.0000 mg | Freq: Once | SUBCUTANEOUS | Status: AC
  Administered 2021-01-22: 240 mg via SUBCUTANEOUS
  Filled 2021-01-22: qty 6

## 2021-01-28 ENCOUNTER — Other Ambulatory Visit: Payer: Self-pay | Admitting: *Deleted

## 2021-01-28 DIAGNOSIS — R6889 Other general symptoms and signs: Secondary | ICD-10-CM

## 2021-01-28 DIAGNOSIS — C61 Malignant neoplasm of prostate: Secondary | ICD-10-CM

## 2021-01-28 DIAGNOSIS — D649 Anemia, unspecified: Secondary | ICD-10-CM

## 2021-01-29 ENCOUNTER — Observation Stay: Payer: Medicare Other

## 2021-01-29 ENCOUNTER — Inpatient Hospital Stay: Payer: Medicare Other

## 2021-01-29 ENCOUNTER — Emergency Department: Payer: Medicare Other

## 2021-01-29 ENCOUNTER — Other Ambulatory Visit: Payer: Self-pay

## 2021-01-29 ENCOUNTER — Inpatient Hospital Stay
Admission: EM | Admit: 2021-01-29 | Discharge: 2021-02-06 | DRG: 070 | Disposition: A | Discharge status: Hospice | Payer: Medicare Other | Attending: Internal Medicine | Admitting: Internal Medicine

## 2021-01-29 ENCOUNTER — Inpatient Hospital Stay: Payer: Medicare Other | Admitting: Internal Medicine

## 2021-01-29 ENCOUNTER — Telehealth: Payer: Self-pay | Admitting: *Deleted

## 2021-01-29 ENCOUNTER — Encounter: Payer: Self-pay | Admitting: Medical Oncology

## 2021-01-29 DIAGNOSIS — S065XAA Traumatic subdural hemorrhage with loss of consciousness status unknown, initial encounter: Secondary | ICD-10-CM

## 2021-01-29 DIAGNOSIS — J300 Vasomotor rhinitis: Secondary | ICD-10-CM | POA: Diagnosis present

## 2021-01-29 DIAGNOSIS — S06899A Other specified intracranial injury with loss of consciousness of unspecified duration, initial encounter: Secondary | ICD-10-CM | POA: Diagnosis present

## 2021-01-29 DIAGNOSIS — R627 Adult failure to thrive: Secondary | ICD-10-CM | POA: Diagnosis present

## 2021-01-29 DIAGNOSIS — Z888 Allergy status to other drugs, medicaments and biological substances status: Secondary | ICD-10-CM

## 2021-01-29 DIAGNOSIS — R4182 Altered mental status, unspecified: Secondary | ICD-10-CM

## 2021-01-29 DIAGNOSIS — G3184 Mild cognitive impairment, so stated: Secondary | ICD-10-CM | POA: Diagnosis present

## 2021-01-29 DIAGNOSIS — N179 Acute kidney failure, unspecified: Secondary | ICD-10-CM

## 2021-01-29 DIAGNOSIS — E860 Dehydration: Secondary | ICD-10-CM | POA: Diagnosis present

## 2021-01-29 DIAGNOSIS — E43 Unspecified severe protein-calorie malnutrition: Secondary | ICD-10-CM | POA: Diagnosis present

## 2021-01-29 DIAGNOSIS — I129 Hypertensive chronic kidney disease with stage 1 through stage 4 chronic kidney disease, or unspecified chronic kidney disease: Secondary | ICD-10-CM | POA: Diagnosis present

## 2021-01-29 DIAGNOSIS — N189 Chronic kidney disease, unspecified: Secondary | ICD-10-CM | POA: Diagnosis present

## 2021-01-29 DIAGNOSIS — Z79899 Other long term (current) drug therapy: Secondary | ICD-10-CM

## 2021-01-29 DIAGNOSIS — S0689AA Other specified intracranial injury with loss of consciousness status unknown, initial encounter: Secondary | ICD-10-CM | POA: Diagnosis present

## 2021-01-29 DIAGNOSIS — S065X9A Traumatic subdural hemorrhage with loss of consciousness of unspecified duration, initial encounter: Secondary | ICD-10-CM

## 2021-01-29 DIAGNOSIS — Z8673 Personal history of transient ischemic attack (TIA), and cerebral infarction without residual deficits: Secondary | ICD-10-CM

## 2021-01-29 DIAGNOSIS — F419 Anxiety disorder, unspecified: Secondary | ICD-10-CM | POA: Diagnosis present

## 2021-01-29 DIAGNOSIS — Z20822 Contact with and (suspected) exposure to covid-19: Secondary | ICD-10-CM | POA: Diagnosis present

## 2021-01-29 DIAGNOSIS — Z87891 Personal history of nicotine dependence: Secondary | ICD-10-CM

## 2021-01-29 DIAGNOSIS — Z881 Allergy status to other antibiotic agents status: Secondary | ICD-10-CM

## 2021-01-29 DIAGNOSIS — Z7952 Long term (current) use of systemic steroids: Secondary | ICD-10-CM

## 2021-01-29 DIAGNOSIS — N1832 Chronic kidney disease, stage 3b: Secondary | ICD-10-CM | POA: Diagnosis present

## 2021-01-29 DIAGNOSIS — D61818 Other pancytopenia: Secondary | ICD-10-CM | POA: Diagnosis present

## 2021-01-29 DIAGNOSIS — Z66 Do not resuscitate: Secondary | ICD-10-CM | POA: Diagnosis present

## 2021-01-29 DIAGNOSIS — C7951 Secondary malignant neoplasm of bone: Secondary | ICD-10-CM | POA: Diagnosis present

## 2021-01-29 DIAGNOSIS — C61 Malignant neoplasm of prostate: Secondary | ICD-10-CM | POA: Diagnosis present

## 2021-01-29 DIAGNOSIS — I629 Nontraumatic intracranial hemorrhage, unspecified: Secondary | ICD-10-CM

## 2021-01-29 DIAGNOSIS — W1830XA Fall on same level, unspecified, initial encounter: Secondary | ICD-10-CM | POA: Diagnosis present

## 2021-01-29 DIAGNOSIS — Z515 Encounter for palliative care: Secondary | ICD-10-CM

## 2021-01-29 DIAGNOSIS — Z85828 Personal history of other malignant neoplasm of skin: Secondary | ICD-10-CM

## 2021-01-29 DIAGNOSIS — K219 Gastro-esophageal reflux disease without esophagitis: Secondary | ICD-10-CM | POA: Diagnosis present

## 2021-01-29 DIAGNOSIS — Z681 Body mass index (BMI) 19 or less, adult: Secondary | ICD-10-CM

## 2021-01-29 DIAGNOSIS — E875 Hyperkalemia: Secondary | ICD-10-CM | POA: Diagnosis not present

## 2021-01-29 DIAGNOSIS — F0390 Unspecified dementia without behavioral disturbance: Secondary | ICD-10-CM | POA: Diagnosis present

## 2021-01-29 DIAGNOSIS — Z882 Allergy status to sulfonamides status: Secondary | ICD-10-CM

## 2021-01-29 DIAGNOSIS — G9341 Metabolic encephalopathy: Secondary | ICD-10-CM | POA: Diagnosis not present

## 2021-01-29 DIAGNOSIS — F32A Depression, unspecified: Secondary | ICD-10-CM | POA: Diagnosis present

## 2021-01-29 DIAGNOSIS — Z9114 Patient's other noncompliance with medication regimen: Secondary | ICD-10-CM

## 2021-01-29 LAB — BASIC METABOLIC PANEL WITH GFR
Anion gap: 9 (ref 5–15)
BUN: 119 mg/dL — ABNORMAL HIGH (ref 8–23)
CO2: 19 mmol/L — ABNORMAL LOW (ref 22–32)
Calcium: 8.1 mg/dL — ABNORMAL LOW (ref 8.9–10.3)
Chloride: 115 mmol/L — ABNORMAL HIGH (ref 98–111)
Creatinine, Ser: 2.07 mg/dL — ABNORMAL HIGH (ref 0.61–1.24)
GFR, Estimated: 30 mL/min — ABNORMAL LOW
Glucose, Bld: 109 mg/dL — ABNORMAL HIGH (ref 70–99)
Potassium: 4.8 mmol/L (ref 3.5–5.1)
Sodium: 143 mmol/L (ref 135–145)

## 2021-01-29 LAB — URINALYSIS, COMPLETE (UACMP) WITH MICROSCOPIC
Bacteria, UA: NONE SEEN
Bilirubin Urine: NEGATIVE
Glucose, UA: NEGATIVE mg/dL
Hgb urine dipstick: NEGATIVE
Ketones, ur: NEGATIVE mg/dL
Leukocytes,Ua: NEGATIVE
Nitrite: NEGATIVE
Protein, ur: NEGATIVE mg/dL
Specific Gravity, Urine: 1.017 (ref 1.005–1.030)
pH: 5 (ref 5.0–8.0)

## 2021-01-29 LAB — CBC
HCT: 23.4 % — ABNORMAL LOW (ref 39.0–52.0)
Hemoglobin: 8.7 g/dL — ABNORMAL LOW (ref 13.0–17.0)
MCH: 36.3 pg — ABNORMAL HIGH (ref 26.0–34.0)
MCHC: 37.2 g/dL — ABNORMAL HIGH (ref 30.0–36.0)
MCV: 97.5 fL (ref 80.0–100.0)
Platelets: 145 10*3/uL — ABNORMAL LOW (ref 150–400)
RBC: 2.4 MIL/uL — ABNORMAL LOW (ref 4.22–5.81)
RDW: 19.3 % — ABNORMAL HIGH (ref 11.5–15.5)
WBC: 10.9 10*3/uL — ABNORMAL HIGH (ref 4.0–10.5)
nRBC: 6.5 % — ABNORMAL HIGH (ref 0.0–0.2)

## 2021-01-29 LAB — RESP PANEL BY RT-PCR (FLU A&B, COVID) ARPGX2
Influenza A by PCR: NEGATIVE
Influenza B by PCR: NEGATIVE
SARS Coronavirus 2 by RT PCR: NEGATIVE

## 2021-01-29 LAB — HEPATIC FUNCTION PANEL
ALT: 27 U/L (ref 0–44)
AST: 25 U/L (ref 15–41)
Albumin: 3.4 g/dL — ABNORMAL LOW (ref 3.5–5.0)
Alkaline Phosphatase: 446 U/L — ABNORMAL HIGH (ref 38–126)
Bilirubin, Direct: <0.1 mg/dL (ref 0.0–0.2)
Total Bilirubin: 0.8 mg/dL (ref 0.3–1.2)
Total Protein: 7.2 g/dL (ref 6.5–8.1)

## 2021-01-29 LAB — TROPONIN I (HIGH SENSITIVITY): Troponin I (High Sensitivity): 9 ng/L (ref <18)

## 2021-01-29 LAB — LACTIC ACID, PLASMA: Lactic Acid, Venous: 2 mmol/L (ref 0.5–1.9)

## 2021-01-29 LAB — CK: Total CK: 26 U/L — ABNORMAL LOW (ref 49–397)

## 2021-01-29 MED ORDER — SERTRALINE HCL 50 MG PO TABS
50.0000 mg | ORAL_TABLET | Freq: Every day | ORAL | Status: DC
  Administered 2021-01-30 – 2021-02-02 (×4): 50 mg via ORAL
  Filled 2021-01-29 (×5): qty 1

## 2021-01-29 MED ORDER — PREDNISONE 20 MG PO TABS
20.0000 mg | ORAL_TABLET | Freq: Every day | ORAL | Status: DC
  Administered 2021-01-30 – 2021-02-02 (×4): 20 mg via ORAL
  Filled 2021-01-29 (×4): qty 1

## 2021-01-29 MED ORDER — ONDANSETRON HCL 4 MG PO TABS
4.0000 mg | ORAL_TABLET | Freq: Four times a day (QID) | ORAL | Status: DC | PRN
  Filled 2021-01-29: qty 1

## 2021-01-29 MED ORDER — HALOPERIDOL LACTATE 5 MG/ML IJ SOLN
1.0000 mg | Freq: Four times a day (QID) | INTRAMUSCULAR | Status: AC | PRN
  Administered 2021-01-30 – 2021-02-04 (×2): 1 mg via INTRAMUSCULAR
  Filled 2021-01-29 (×2): qty 1

## 2021-01-29 MED ORDER — KCL IN DEXTROSE-NACL 40-5-0.9 MEQ/L-%-% IV SOLN
INTRAVENOUS | Status: AC
  Filled 2021-01-29: qty 1000

## 2021-01-29 MED ORDER — HALOPERIDOL LACTATE 5 MG/ML IJ SOLN
5.0000 mg | Freq: Once | INTRAMUSCULAR | Status: AC
  Administered 2021-01-29: 5 mg via INTRAMUSCULAR
  Filled 2021-01-29: qty 1

## 2021-01-29 MED ORDER — SODIUM CHLORIDE 0.9 % IV SOLN: Freq: Once | INTRAVENOUS | Status: AC

## 2021-01-29 MED ORDER — ONDANSETRON HCL 4 MG/2ML IJ SOLN: 4.0000 mg | Freq: Four times a day (QID) | INTRAMUSCULAR | Status: DC | PRN

## 2021-01-29 MED ORDER — LOPERAMIDE HCL 2 MG PO CAPS
2.0000 mg | ORAL_CAPSULE | ORAL | Status: DC | PRN
  Administered 2021-01-31: 2 mg via ORAL
  Filled 2021-01-29: qty 1

## 2021-01-29 MED ORDER — ACETAMINOPHEN 325 MG PO TABS
650.0000 mg | ORAL_TABLET | Freq: Four times a day (QID) | ORAL | Status: DC | PRN
Start: 2021-01-29 — End: 2021-02-06
  Administered 2021-01-30 – 2021-02-04 (×4): 650 mg via ORAL
  Filled 2021-01-29 (×4): qty 2

## 2021-01-29 MED ORDER — SODIUM CHLORIDE 0.9 % IV BOLUS
1000.0000 mL | Freq: Once | INTRAVENOUS | Status: AC
  Administered 2021-01-29: 1000 mL via INTRAVENOUS

## 2021-01-29 MED ORDER — ALPRAZOLAM 0.5 MG PO TABS
0.2500 mg | ORAL_TABLET | Freq: Once | ORAL | Status: AC
  Administered 2021-01-29: 0.25 mg via ORAL
  Filled 2021-01-29: qty 1

## 2021-01-29 MED ORDER — SODIUM CHLORIDE 0.9 % IV BOLUS
500.0000 mL | Freq: Once | INTRAVENOUS | Status: AC
  Administered 2021-01-29: 500 mL via INTRAVENOUS

## 2021-01-29 MED ORDER — ACETAMINOPHEN 650 MG RE SUPP: 650.0000 mg | Freq: Four times a day (QID) | RECTAL | Status: DC | PRN

## 2021-01-29 NOTE — ED Notes (Signed)
Patient transported to CT 

## 2021-01-29 NOTE — ED Notes (Signed)
Patient noted to have removed oxygen cannula from nose. Patient encouraged to leave nasal prongs in place r/t oxygen sat of 86% on RA. Patient refusing to allow RN to replace prongs. Blow by provided, as patient allowed RN to place blow by mask on patient's face. Oxygen sat remains >90% with blowby in place.

## 2021-01-29 NOTE — ED Triage Notes (Signed)
First Nurse Note:  Arrives with C/O weakness.  Decreased PO intake and no BM x 1 week.  Hx Bone and prostate CA.  VS: 98/60 93 P 99 SpO2 CBG 118 98.1- T

## 2021-01-29 NOTE — ED Notes (Signed)
ED Provider at bedside. 

## 2021-01-29 NOTE — ED Notes (Signed)
Patient noted to be wandering in hallway. Patient escorted back to ED19. Patient states "I'm bored, I want to get out of here." Patient alert, oriented x3. Patient reports "I don't know why I'm here." Patient sat down in hospital bed. Patient notified of current POC.

## 2021-01-29 NOTE — ED Notes (Signed)
Patient noted to be agitated at this time. Patient attempting to get out of bed, pulling at lines and wires. Patient difficult to redirect. Dr. Quentin Cornwall notified. IM Haldol ordered.

## 2021-01-29 NOTE — H&P (Signed)
History and Physical    [name]:[id number] DOB: 1929/08/31 DOA: 01/29/2021  PCP: System, Provider Not In  Patient coming from: Home  I have personally briefly reviewed patient's old medical records in [place]  Chief Complaint: Altered mental status  HPI: Taylor Hogan is a 87 y.o. 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  History is supplemented by patient's son and POA at bedside.  Per son, patient has had several weeks of functional decline.  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  He has been started on chemotherapy with [place] Koller with last infusion on 01/22/2021.  Since then patient has been having diarrhea, fatigue, generalized weakness, and poor appetite.  It appears he has not been taking his medications regularly per son.  He has been having difficulty walking with imbalance, unclear if he has had any falls recently.  Per son he refuses to use a cane/walker for assistance.  He was recently started on prednisone 20 mg for poor appetite which seemed to have provided some benefit.  ED Course:  Initial vitals showed BP 106/59, pulse 78, RR 17, temp 97.5 F, SPO2 99% on room air.  Labs show WBC 10.9, hemoglobin 8.7, platelets 145,000, sodium 143, potassium 4.8, bicarb 19, BUN 119, creatinine 2.07 (baseline 1.5-1.6), serum glucose 109, high-sensitivity troponin 9, AST 25, ALT 27, alk phos 446, total bilirubin 0.8, CK20 6, lactic acid 2.0.  SARS-CoV-2 PCR negative.  Influenza A/B PCR's are negative.  Urinalysis negative for UTI.  CT abdomen/pelvis without contrast negative for acute abdominal pelvic findings.  No evidence of bowel obstruction.  Sigmoid diverticulosis without evidence of acute diverticulitis noted.   Extensive sclerosis of visualized osseous structures noted consistent with metastatic disease in setting of known prostate cancer.  CT head without contrast showed acute on chronic right holohemispheric collection measuring up to 7 mm with hyperdense focus along the inferior aspect of collection representing small amount of acute blood products.  Minimal local mass-effect.  Encephalomalacia in the right posterior parietal lobe is seen and unchanged.  Chronic lacunar type infarct in the basal ganglia and cerebellum noted.  Portable chest x-ray negative for focal consolidation, edema, or effusion.  Extensive sclerosis of visualized osseous structures noted.  Patient was given 1.5 L normal saline.  He required oral Xanax and IV Haldol for agitation.  EDP discussed with on-call neurosurgery, Dr. Cari Caraway who recommended follow-up CT head in 6 hours.  The hospitalist service was consulted to admit for further evaluation and management.  Review of Systems:  Unable to obtain full review of systems due to encephalopathy.   Past Medical History:  Diagnosis Date   Anxiety    Cancer ([place])    skin   Cataract    Depression     Past Surgical History:  Procedure Laterality Date   CATARACT EXTRACTION  2012   polyp removal     TONSILLECTOMY      Social History:  reports that he has quit smoking. He has quit using smokeless tobacco. He reports that he does not drink alcohol and does not use drugs.  Allergies  Allergen Reactions   Amoxicillin Diarrhea   Lansoprazole Diarrhea   Sulfa Antibiotics Nausea And Vomiting    Family History  Problem Relation Age of Onset   Emphysema Mother  Prior to Admission medications   Medication Sig Start Date End Date Taking? Authorizing Provider  acetaminophen-codeine (TYLENOL #3) 300-30 MG tablet Take 1 tablet by mouth every 8 (eight) hours as needed for moderate pain. 01/17/21  Yes Cammie Sickle, MD  lisinopril (ZESTRIL) 5 MG tablet Take 5 mg  by mouth daily. 11/22/20  Yes [provider]  predniSONE (DELTASONE) 20 MG tablet Take 1 tablet (20 mg total) by mouth daily with breakfast. Once a day with food x 10 days. Patient not taking: Reported on 01/29/2021 01/17/21   Cammie Sickle, MD  sertraline (ZOLOFT) 50 MG tablet Take 1 tablet by mouth daily. Patient not taking: Reported on 01/29/2021 10/15/20   [provider]    Physical Exam: Vitals:   01/29/21 1654 01/29/21 1700 01/29/21 1730 01/29/21 1800  BP:  (!) 91/57 109/62 (!) 102/54  Pulse:  90  90  Resp:  _0 Temp:      TempSrc:      SpO2: 96% 97%  96%  Weight:      Height:       Constitutional: Frail elderly man resting in bed, NAD, calm, comfortable Eyes: PERRL, lids and conjunctivae normal ENMT: Mucous membranes are dry. Posterior pharynx clear of any exudate or lesions.Normal dentition.  Neck: normal, supple, no masses. Respiratory: clear to auscultation anteriorly. Normal respiratory effort. No accessory muscle use.  Cardiovascular: Regular rate and rhythm, soft systolic murmur present. No extremity edema. 2+ pedal pulses. Abdomen: no tenderness, no masses palpated. No hepatosplenomegaly. Bowel sounds positive.  Musculoskeletal: no clubbing / cyanosis. No joint deformity upper and lower extremities. Good ROM, no contractures. Normal muscle tone.  Skin: no rashes, lesions, ulcers. No induration Neurologic: CN 2-12 grossly intact. Sensation intact. Strength 5/5 in all 4.  Psychiatric: Alert and oriented to self.  Knows that he is in the hospital and recognizes his son at bedside.  Not oriented to the year, president, or situation.   Labs on Admission: I have personally reviewed following labs and imaging studies  CBC: Recent Labs  Lab 01/29/21 1406  WBC 10.9*  HGB 8.7*  HCT 23.4*  MCV 97.5  PLT 299*   Basic Metabolic Panel: Recent Labs  Lab 01/29/21 1406  NA 143  K 4.8  CL 115*  CO2 19*  GLUCOSE 109*  BUN 119*  CREATININE  2.07*  CALCIUM 8.1*   GFR: Estimated Creatinine Clearance: 19.5 mL/min (A) (by C-G formula based on SCr of 2.07 mg/dL (H)). Liver Function Tests: Recent Labs  Lab 01/29/21 1516  AST 25  ALT 27  ALKPHOS 446*  BILITOT 0.8  PROT 7.2  ALBUMIN 3.4*   No results for input(s): LIPASE, AMYLASE in the last 168 hours. No results for input(s): AMMONIA in the last 168 hours. Coagulation Profile: No results for input(s): INR, PROTIME in the last 168 hours. Cardiac Enzymes: Recent Labs  Lab 01/29/21 1516  CKTOTAL 26*   BNP (last 3 results) No results for input(s): PROBNP in the last 8760 hours. HbA1C: No results for input(s): HGBA1C in the last 72 hours. CBG: No results for input(s): GLUCAP in the last 168 hours. Lipid Profile: No results for input(s): CHOL, HDL, LDLCALC, TRIG, CHOLHDL, LDLDIRECT in the last 72 hours. Thyroid Function Tests: No results for input(s): TSH, T4TOTAL, FREET4, T3FREE, THYROIDAB in the last 72 hours. Anemia Panel: No results for input(s): VITAMINB12, FOLATE, FERRITIN, TIBC, IRON, RETICCTPCT in the last 72 hours. Urine analysis:    Component Value Date/Time  COLORURINE YELLOW (A) 01/29/2021 1516   APPEARANCEUR HAZY (A) 01/29/2021 1516   APPEARANCEUR Clear 04/13/2013 0102   LABSPEC 1.017 01/29/2021 1516   LABSPEC 1.018 04/13/2013 0102   PHURINE 5.0 01/29/2021 1516   GLUCOSEU NEGATIVE 01/29/2021 1516   GLUCOSEU Negative 04/13/2013 0102   HGBUR NEGATIVE 01/29/2021 1516   BILIRUBINUR NEGATIVE 01/29/2021 1516   BILIRUBINUR Negative 04/13/2013 0102   KETONESUR NEGATIVE 01/29/2021 1516   PROTEINUR NEGATIVE 01/29/2021 1516   NITRITE NEGATIVE 01/29/2021 1516   LEUKOCYTESUR NEGATIVE 01/29/2021 1516   LEUKOCYTESUR Negative 04/13/2013 0102    Radiological Exams on Admission: CT ABDOMEN PELVIS WO CONTRAST  Result Date: 01/29/2021 CLINICAL DATA:  Weakness, decreased p.o. intake. No bowel movement for 1 week. History of prostate cancer EXAM: CT ABDOMEN AND  PELVIS WITHOUT CONTRAST TECHNIQUE: Multidetector CT imaging of the abdomen and pelvis was performed following the standard protocol without IV contrast. COMPARISON:  01/03/2021 FINDINGS: Lower chest: Included lung bases are clear.  Heart size is normal. Hepatobiliary: Stable size and appearance of multiple low-density lesions within the liver, largest of which is in the anterior left hepatic lobe measuring approximately 4.0 cm with internal fluid density compatible with a simple cyst. Unremarkable gallbladder. No hyperdense gallstone. No biliary dilatation. Pancreas: Unremarkable. No pancreatic ductal dilatation or surrounding inflammatory changes. Spleen: Normal in size without focal abnormality. Adrenals/Urinary Tract: Unremarkable adrenal glands. Tiny vascular calcifications versus nonobstructing calculi within each kidney are unchanged. No hydronephrosis. Urinary bladder appears within normal limits. Stomach/Bowel: Small hiatal hernia. Stomach otherwise within normal limits. No dilated loops bowel to suggest obstruction. Sigmoid diverticulosis. No focal bowel wall thickening or inflammatory changes. Vascular/Lymphatic: Aortoiliac atherosclerosis without aneurysm. Circumaortic left renal vein, an anatomic variant. No abdominopelvic lymphadenopathy. Reproductive: Mildly enlarged prostate gland. Other: No free fluid. No abdominopelvic fluid collection. No pneumoperitoneum. No abdominal wall hernia. Musculoskeletal: Markedly abnormal appearance of the visualized osseous structures with extensive sclerosis most suggestive of diffuse osseous metastatic disease in the setting of known prostate cancer. No pathologic fracture identified. Degenerative disc disease of the lumbar spine. IMPRESSION: 1. No acute abdominopelvic findings. No evidence of bowel obstruction. 2. Sigmoid diverticulosis without evidence of acute diverticulitis. 3. Redemonstrated markedly abnormal appearance of the visualized osseous structures with  extensive sclerosis most suggestive of diffuse osseous metastatic disease in the setting of known prostate cancer. No pathologic fracture identified. Aortic Atherosclerosis (ICD10-I70.0). Electronically Signed   By: Davina Poke D.O.   On: 01/29/2021 16:07   CT Head Wo Contrast  Result Date: 01/29/2021 CLINICAL DATA:  Altered mental status, weakness EXAM: CT HEAD WITHOUT CONTRAST TECHNIQUE: Contiguous axial images were obtained from the base of the skull through the vertex without intravenous contrast. COMPARISON:  MRI brain February 23, 2018 and head CT February 18, 2010 FINDINGS: Brain: Mixed density right whole hemispheric collection measuring up to 7 mm on coronal image 24. There is a hyperdense focus along the inferior aspect of the collection also seen on image 24 of the coronal sequence. Mild effacement of the parenchyma adjacent to the collection without other findings of mass effect. Age related global parenchymal volume loss with ex vacuo dilatation of the ventricular system. Similar burden of moderate to severe chronic ischemic small vessel white matter disease. Encephalomalacia in the right posterior parietal lobe appears unchanged. Chronic lacunar type infarcts in the basal ganglia and cerebellum. No evidence of acute large vascular territory infarction, parenchymal hemorrhage, hydrocephalus, or mass lesion. Vascular: No hyperdense vessel. Atherosclerotic calcifications of the internal carotid arteries at the skull  base. Skull: Normal. Negative for fracture or focal lesion. Sinuses/Orbits: Visualized portions of the paranasal sinuses and ethmoid air cells are predominantly clear. Prior bilateral lens surgery. Other: None IMPRESSION: 1. Acute on chronic right whole hemispheric collection measuring up to 7 mm. There is a hyperdense focus along the inferior aspect of the collection which likely represents a small amount of acute blood products. Minimal local mass effect. 2. Age related global parenchymal  volume loss and moderate to severe chronic ischemic small vessel white matter disease. 3. Encephalomalacia in the right posterior parietal lobe appears unchanged. Chronic lacunar type infarcts in the basal ganglia and cerebellum. These results were called by telephone at the time of interpretation on 01/29/2021 at 5:27 pm to provider Merlyn Lot , who verbally acknowledged these results. Electronically Signed   By: Dahlia Bailiff MD   On: 01/29/2021 17:28   DG Chest Portable 1 View  Result Date: 01/29/2021 CLINICAL DATA:  Weakness concern for infection. EXAM: PORTABLE CHEST 1 VIEW COMPARISON:  CT abdomen from same day. FINDINGS: The heart size and mediastinal contours are within normal limits. Both lungs are clear. Extensive sclerosis of the visualized osseous structures, suggestive of diffuse metastatic disease in this patient with known prostate cancer. IMPRESSION: 1. No acute cardiopulmonary disease. 2. Extensive sclerosis of the visualized osseous structures, suggestive of diffuse metastatic disease in this patient with known prostate cancer. Electronically Signed   By: Dahlia Bailiff MD   On: 01/29/2021 17:30    EKG: Personally reviewed. Normal sinus rhythm without acute ischemic changes.  Assessment/Plan Principal Problem:   Acute metabolic encephalopathy Active Problems:   Mild cognitive impairment   Prostate cancer metastatic to multiple sites [facility])   Acute kidney injury superimposed on CKD ([place])   Intracranial hematoma ([facility])   Taylor Hogan is a 87 y.o. male with medical history significant for prostate cancer metastatic to bone on active chemotherapy, CKD stage III, history of CVA, pancytopenia, depression/anxiety, and mild cognitive impairment who is admitted with acute metabolic encephalopathy, acute on chronic intracranial hematoma, and AKI.  Acute metabolic encephalopathy: Likely multifactorial related to dehydration, uremia, and acute on chronic intracranial hemorrhage.   Currently no evidence of active infection. -Continue IV fluid hydration -Delirium precautions  Acute on chronic right intracranial hemorrhage: Seen on CT head.  EDP discussed with on-call neurosurgery who recommended follow-up CT in 6 hours, ordered and pending.  AKI on CKD 3: Likely prerenal due to hypovolemia.  Has associated uremia contributing to acute encephalopathy. -Continue IV fluid hydration overnight -Avoid NSAIDs, hold lisinopril  Generalized weakness/failure to thrive/poor appetite: Son reports progressive functional decline since diagnosis of metastatic prostate cancer. -Fall precautions -PT/OT eval -IV fluid hydration as above -Resume prednisone 20 mg daily for appetite -Recommend palliative care consult  Anemia/thrombocytopenia: Hemoglobin and platelets improved from recent baseline.  No obvious bleeding.  Continue to monitor.  Prostate cancer metastatic to bone: Follows with oncology, Dr. Rogue Bussing.  Currently on chemotherapy with Firmagon.  Hypertension: Hold lisinopril with AKI.  Mild cognitive impairment/depression/anxiety: Resume sertraline.  Delirium precautions.  DVT prophylaxis: SCDs Code Status: Full code, son/POA wants to discuss further with patient spouse Family Communication: Discussed with patient's son at bedside Disposition Plan: From home, dispo pending clinical progress Consults called: EDP discussed with on-call neurosurgery Level of care: Med-Surg Admission status:  Status is: Observation  The patient remains OBS appropriate and will d/c before 2 midnights.  Dispo: The patient is from: Home  Anticipated d/c is to:  Home versus SNF              Patient currently is not medically stable to d/c.     Zada Finders MD [facility]  If 7PM-7AM, please contact night-coverage [email or web address]  01/29/2021, 7:36 PM

## 2021-01-29 NOTE — Telephone Encounter (Signed)
Patient's son contacted Nira Conn, Therapist, sports. Per son, he would like to cnl today's apt. Pt is currently altered mental status/disoriented and displaying combative behavior with family. Patient keeps taking his pull ups/depends off and urinating and defecating on himself. Pt extremely weak. Son is concerned that hgb has dropped. Pt's care continues to decline since cancer diagnosis. Son doesn't know if home health is needed at this time due to patient's weakness. Last hgb approx. 2 weeks ago was 6.7  Discussed with son in detail that patient could have an infection such as a UTI or pt's hgb could have dropped. Given the situation above, patient may need ER evaluation/hospitalization if infection is present. Pt may need palliative or hospice care consult to discuss goals of care while in [place]. Son agrees and feels that it would be best to transport pt to ER by EMS.

## 2021-01-29 NOTE — ED Notes (Signed)
Son at bedside. Son reports he lives alone at home with his wife, and wife is requesting help at home.

## 2021-01-29 NOTE — ED Notes (Signed)
Patient noted to have sticky bowel movement. Stool shown to Dr. Quentin Cornwall for concern of GI bleed. Bedside hemoccult positive. Dr. Quentin Cornwall aware. Stool sample sent to lab, no new orders received.

## 2021-01-29 NOTE — ED Notes (Signed)
Lindsay RN aware of assigned bed 

## 2021-01-29 NOTE — ED Triage Notes (Signed)
Pt from home via ems with reports of weakness, decreased PO intake and no BM x 1 week. Pt is poor historian. States that he feels fine.

## 2021-01-29 NOTE — ED Notes (Signed)
Pt in hallway. Family standing with pt. This RN redirecting pt back into room and in bed. EDP at bedside.

## 2021-01-29 NOTE — ED Notes (Signed)
Patient and family updated on POC. Patient laying on right side, respirations even and unlabored.

## 2021-01-29 NOTE — ED Provider Notes (Signed)
[facility] Emergency Department Provider Note    Event Date/Time   First MD Initiated Contact with Patient 01/29/21 1501     (approximate)  I have reviewed the triage vital signs and the nursing notes.   HISTORY  Chief Complaint Weakness    HPI Taylor Hogan is a 87 y.o. male with a history of anxiety as well as prostate cancer on chemotherapy as metastatic disease as well presented to the ER for evaluation of decreased p.o. intake increased agitation at home.  Son had to obtain age 54 today to be able to get him to the hospital.  Reportedly refusing to seek care.  He is not currently on palliative care.  Denies any pain or focal complaints.  Reported decreased urine output.   Past Medical History:  Diagnosis Date   Anxiety    Cancer ([place])    skin   Cataract    Depression    Family History  Problem Relation Age of Onset   Emphysema Mother    Past Surgical History:  Procedure Laterality Date   CATARACT EXTRACTION  2012   polyp removal     TONSILLECTOMY     Patient Active Problem List   Diagnosis Date Noted   Acute metabolic encephalopathy 75/91/6384   Acute kidney injury superimposed on CKD ([place]) 01/29/2021   Intracranial hematoma ([place]) 01/29/2021   Prostate cancer metastatic to multiple sites ([place]) 01/17/2021   Anxiety 12/26/2020   GERD (gastroesophageal reflux disease) 12/26/2020   Hypercholesterolemia 12/26/2020   Depression 10/19/2020   Mild cognitive impairment 02/04/2018   Seizure-like activity ([place]) 02/04/2018   History of stroke 11/26/2017   Chronic pruritus 03/31/2017   Eosinophil count raised 03/31/2017   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 Authorizing Provider  acetaminophen-codeine (TYLENOL #3) 300-30 MG tablet Take 1 tablet by mouth every 8 (eight) hours as needed for  moderate pain. 01/17/21  Yes Cammie Sickle, MD  lisinopril (ZESTRIL) 5 MG tablet Take 5 mg by mouth daily. 11/22/20  Yes [provider]  predniSONE (DELTASONE) 20 MG tablet Take 1 tablet (20 mg total) by mouth daily with breakfast. Once a day with food x 10 days. Patient not taking: Reported on 01/29/2021 01/17/21   Cammie Sickle, MD  sertraline (ZOLOFT) 50 MG tablet Take 1 tablet by mouth daily. Patient not taking: Reported on 01/29/2021 10/15/20   [provider]    Allergies Amoxicillin, Lansoprazole, and Sulfa antibiotics    Social History Social History   Tobacco Use   Smoking status: Former    Pack years: 0.00   Smokeless tobacco: Former    Types: Snuff  Substance Use Topics   Alcohol use: No   Drug use: No    Review of Systems Patient denies headaches, rhinorrhea, blurry vision, numbness, shortness of breath, chest pain, edema, cough, abdominal pain, nausea, vomiting, diarrhea, dysuria, fevers, rashes or hallucinations unless otherwise stated above in HPI. ____________________________________________   PHYSICAL EXAM:  VITAL SIGNS: Vitals:   01/29/21 2230 01/29/21 2313  BP: 106/60 (!) 100/58  Pulse: 60 65  Resp: 18 16  Temp:  (!) 97.4 F (36.3 C)  SpO2: 92% 100%    Constitutional: Alert, frail and elderly appearing Eyes: Conjunctivae are normal.  Head: Atraumatic. Nose: No congestion/rhinnorhea. Mouth/Throat: Mucous membranes are moist.   Neck: No stridor. Painless  ROM.  Cardiovascular: Normal rate, regular rhythm. Grossly normal heart sounds.  Good peripheral circulation. Respiratory: Normal respiratory effort.  No retractions. Lungs CTAB. Gastrointestinal: Soft and nontender. No distention. No abdominal bruits. No CVA tenderness. Genitourinary: deferred Musculoskeletal: No lower extremity tenderness nor edema.  No joint effusions. Neurologic:  Normal speech and language. No gross focal neurologic deficits are appreciated. No  facial droop Skin:  Skin is warm, dry and intact. No rash noted. Psychiatric: Mood and affect are anxious, pacing about room  ____________________________________________   LABS (all labs ordered are listed, but only abnormal results are displayed)  Results for orders placed or performed during the hospital encounter of 01/29/21 (from the past 24 hour(s))  Basic metabolic panel     Status: Abnormal   Collection Time: 01/29/21  2:06 PM  Result Value Ref Range   Sodium 143 135 - 145 mmol/L   Potassium 4.8 3.5 - 5.1 mmol/L   Chloride 115 (H) 98 - 111 mmol/L   CO2 19 (L) 22 - 32 mmol/L   Glucose, Bld 109 (H) 70 - 99 mg/dL   BUN 119 (H) 8 - 23 mg/dL   Creatinine, Ser 2.07 (H) 0.61 - 1.24 mg/dL   Calcium 8.1 (L) 8.9 - 10.3 mg/dL   GFR, Estimated 30 (L) >60 mL/min   Anion gap 9 5 - 15  CBC     Status: Abnormal   Collection Time: 01/29/21  2:06 PM  Result Value Ref Range   WBC 10.9 (H) 4.0 - 10.5 K/uL   RBC 2.40 (L) 4.22 - 5.81 MIL/uL   Hemoglobin 8.7 (L) 13.0 - 17.0 g/dL   HCT 23.4 (L) 39.0 - 52.0 %   MCV 97.5 80.0 - 100.0 fL   MCH 36.3 (H) 26.0 - 34.0 pg   MCHC 37.2 (H) 30.0 - 36.0 g/dL   RDW 19.3 (H) 11.5 - 15.5 %   Platelets 145 (L) 150 - 400 K/uL   nRBC 6.5 (H) 0.0 - 0.2 %  Troponin I (High Sensitivity)     Status: None   Collection Time: 01/29/21  2:06 PM  Result Value Ref Range   Troponin I (High Sensitivity) 9 <18 ng/L  Urinalysis, Complete w Microscopic Urine, Clean Catch     Status: Abnormal   Collection Time: 01/29/21  3:16 PM  Result Value Ref Range   Color, Urine YELLOW (A) YELLOW   APPearance HAZY (A) CLEAR   Specific Gravity, Urine 1.017 1.005 - 1.030   pH 5.0 5.0 - 8.0   Glucose, UA NEGATIVE NEGATIVE mg/dL   Hgb urine dipstick NEGATIVE NEGATIVE   Bilirubin Urine NEGATIVE NEGATIVE   Ketones, ur NEGATIVE NEGATIVE mg/dL   Protein, ur NEGATIVE NEGATIVE mg/dL   Nitrite NEGATIVE NEGATIVE   Leukocytes,Ua NEGATIVE NEGATIVE   RBC / HPF 0-5 0 - 5 RBC/hpf   WBC, UA  0-5 0 - 5 WBC/hpf   Bacteria, UA NONE SEEN NONE SEEN   Squamous Epithelial / LPF 0-5 0 - 5   Mucus PRESENT   Resp Panel by RT-PCR (Flu A&B, Covid) Nasopharyngeal Swab     Status: None   Collection Time: 01/29/21  3:16 PM   Specimen: Nasopharyngeal Swab; Nasopharyngeal(NP) swabs in vial transport medium  Result Value Ref Range   SARS Coronavirus 2 by RT PCR NEGATIVE NEGATIVE   Influenza A by PCR NEGATIVE NEGATIVE   Influenza B by PCR NEGATIVE NEGATIVE  Hepatic function panel     Status: Abnormal   Collection Time: 01/29/21  3:16  PM  Result Value Ref Range   Total Protein 7.2 6.5 - 8.1 g/dL   Albumin 3.4 (L) 3.5 - 5.0 g/dL   AST 25 15 - 41 U/L   ALT 27 0 - 44 U/L   Alkaline Phosphatase 446 (H) 38 - 126 U/L   Total Bilirubin 0.8 0.3 - 1.2 mg/dL   Bilirubin, Direct <0.1 0.0 - 0.2 mg/dL   Indirect Bilirubin NOT CALCULATED 0.3 - 0.9 mg/dL  CK     Status: Abnormal   Collection Time: 01/29/21  3:16 PM  Result Value Ref Range   Total CK 26 (L) 49 - 397 U/L  Lactic acid, plasma     Status: Abnormal   Collection Time: 01/29/21  3:16 PM  Result Value Ref Range   Lactic Acid, Venous 2.0 (HH) 0.5 - 1.9 mmol/L   ____________________________________________  EKG My review and personal interpretation at Time: 14:02   Indication: weakness  Rate: 85  Rhythm: sinus Axis: normal Other: normal intervals, no stemi ____________________________________________  RADIOLOGY  I personally reviewed all radiographic images ordered to evaluate for the above acute complaints and reviewed radiology reports and findings.  These findings were personally discussed with the patient.  Please see medical record for radiology report.  ____________________________________________   PROCEDURES  Procedure(s) performed:  .Critical Care  Date/Time: 01/29/2021 11:27 PM Performed by: Merlyn Lot, MD Authorized by: Merlyn Lot, MD   Critical care provider statement:    Critical care time (minutes):   20   Critical care time was exclusive of:  Separately billable procedures and treating other patients   Critical care was necessary to treat or prevent imminent or life-threatening deterioration of the following conditions:  Dehydration   Critical care was time spent personally by me on the following activities:  Development of treatment plan with patient or surrogate, discussions with consultants, evaluation of patient's response to treatment, examination of patient, obtaining history from patient or surrogate, ordering and performing treatments and interventions, ordering and review of laboratory studies, ordering and review of radiographic studies, pulse oximetry, re-evaluation of patient's condition and review of old charts    Critical Care performed: yes ____________________________________________   INITIAL IMPRESSION / [place] / ED COURSE  Pertinent labs & imaging results that were available during my care of the patient were reviewed by me and considered in my medical decision making (see chart for details).   DDX: Dehydration, sepsis, pna, uti, hypoglycemia, cva, drug effect, withdrawal, encephalitis   Taylor Hogan is a 87 y.o. who presents to the ED with presentation as described above.  Blood work and ct imaging ordered for the above differential.  The patient will be placed on continuous pulse oximetry and telemetry for monitoring.  Laboratory evaluation will be sent to evaluate for the above complaints.  Patient is agitated does appear confused.  Will give calming agent and reassess.  Will order IV fluids.   Clinical Course as of 01/29/21 2328  Tue Jan 29, 2021  1625 Mildly elevated lactate I suspect this is more likely secondary to dehydration he is afebrile, no pain.  Reassuring CT abdomen.  Will give IV hydration and reassess.  Awaiting CT imaging of head as well as chest x-ray.  Does appear more calm and relaxed after receiving Xanax. [PR]  2671 Patient  becoming increasingly agitated pulling monitors off.  Does appear encephalopathic due to his agitation will provide additional calming medication form of IM Haldol. [PR]  1831 Discussed results of CT imaging with  patient family.  Discussed CT results and consultation with neurosurgery recommends repeat CT head in 6hours.  Will discuss with hospitalist and it sounds like the primary issue is dehydration and family given his past medical history is interested in discussing palliative care. [PR]    Clinical Course User Index [PR] Merlyn Lot, MD   Repeat CT had improved.  Patient mated to hospitalist service.  The patient was evaluated in Emergency Department today for the symptoms described in the history of present illness. He/she was evaluated in the context of the global COVID-19 pandemic, which necessitated consideration that the patient might be at risk for infection with the SARS-CoV-2 virus that causes COVID-19. Institutional protocols and algorithms that pertain to the evaluation of patients at risk for COVID-19 are in a state of rapid change based on information released by regulatory bodies including the CDC and federal and state organizations. These policies and algorithms were followed during the patient's care in the ED.  As part of my medical decision making, I reviewed the following data within the [place] notes reviewed and incorporated, Labs reviewed, notes from prior ED visits and [place] Controlled Substance Database   ____________________________________________   FINAL CLINICAL IMPRESSION(S) / ED DIAGNOSES  Final diagnoses:  Altered mental status, unspecified altered mental status type  AKI (acute kidney injury) ([place])  SDH (subdural hematoma) ([facility])      NEW MEDICATIONS STARTED DURING THIS VISIT:  Current Discharge Medication List       Note:  This document was prepared using Dragon voice recognition software and may include  unintentional dictation errors.    Merlyn Lot, MD 01/29/21 2328

## 2021-01-29 NOTE — ED Notes (Signed)
Urine sample sent to the lab. Patient provided several warm blankets. Patient repositioned for comfort. Family at bedside. Patient encouraged to remain in bed, and use the call light for any needs. Patient verbalized understanding.

## 2021-01-29 NOTE — ED Notes (Signed)
Patient transported to inpatient unit by Liane Comber, RN.

## 2021-01-29 NOTE — ED Notes (Signed)
Son and patient updated on change in bed assignment and potential wait for new assignment.

## 2021-01-29 NOTE — ED Notes (Signed)
Patient provided urinal and hat and notified of urine sample ordered. Patient's family at bedside notified as well. Patient verbalized understanding.

## 2021-01-29 NOTE — ED Notes (Signed)
Patient and family updated on POC, admission and inpatient bed assignment.

## 2021-01-29 NOTE — ED Notes (Signed)
Patient returned to ED from xray. Patient noted to have oxygen sat of 88% on RA. Oxygen sat changed - oxygen noted to be 89%. Patient placed on 2L via [place], Dr. Quentin Cornwall notified. Oxygen noted to have improved to 94% on 2L via [place].

## 2021-01-30 ENCOUNTER — Ambulatory Visit: Payer: Medicare Other

## 2021-01-30 DIAGNOSIS — J300 Vasomotor rhinitis: Secondary | ICD-10-CM | POA: Diagnosis present

## 2021-01-30 DIAGNOSIS — Z681 Body mass index (BMI) 19 or less, adult: Secondary | ICD-10-CM | POA: Diagnosis not present

## 2021-01-30 DIAGNOSIS — S06899A Other specified intracranial injury with loss of consciousness of unspecified duration, initial encounter: Secondary | ICD-10-CM

## 2021-01-30 DIAGNOSIS — C7951 Secondary malignant neoplasm of bone: Secondary | ICD-10-CM | POA: Diagnosis present

## 2021-01-30 DIAGNOSIS — C61 Malignant neoplasm of prostate: Secondary | ICD-10-CM | POA: Diagnosis present

## 2021-01-30 DIAGNOSIS — F419 Anxiety disorder, unspecified: Secondary | ICD-10-CM | POA: Diagnosis present

## 2021-01-30 DIAGNOSIS — E875 Hyperkalemia: Secondary | ICD-10-CM | POA: Diagnosis not present

## 2021-01-30 DIAGNOSIS — S065X9A Traumatic subdural hemorrhage with loss of consciousness of unspecified duration, initial encounter: Secondary | ICD-10-CM | POA: Diagnosis present

## 2021-01-30 DIAGNOSIS — E43 Unspecified severe protein-calorie malnutrition: Secondary | ICD-10-CM | POA: Diagnosis present

## 2021-01-30 DIAGNOSIS — Z7952 Long term (current) use of systemic steroids: Secondary | ICD-10-CM | POA: Diagnosis not present

## 2021-01-30 DIAGNOSIS — D61818 Other pancytopenia: Secondary | ICD-10-CM | POA: Diagnosis present

## 2021-01-30 DIAGNOSIS — R627 Adult failure to thrive: Secondary | ICD-10-CM | POA: Diagnosis present

## 2021-01-30 DIAGNOSIS — N189 Chronic kidney disease, unspecified: Secondary | ICD-10-CM | POA: Diagnosis not present

## 2021-01-30 DIAGNOSIS — Z515 Encounter for palliative care: Secondary | ICD-10-CM | POA: Diagnosis not present

## 2021-01-30 DIAGNOSIS — D539 Nutritional anemia, unspecified: Secondary | ICD-10-CM | POA: Diagnosis not present

## 2021-01-30 DIAGNOSIS — E860 Dehydration: Secondary | ICD-10-CM | POA: Diagnosis present

## 2021-01-30 DIAGNOSIS — Z66 Do not resuscitate: Secondary | ICD-10-CM | POA: Diagnosis present

## 2021-01-30 DIAGNOSIS — Z79899 Other long term (current) drug therapy: Secondary | ICD-10-CM | POA: Diagnosis not present

## 2021-01-30 DIAGNOSIS — N179 Acute kidney failure, unspecified: Secondary | ICD-10-CM | POA: Diagnosis present

## 2021-01-30 DIAGNOSIS — I629 Nontraumatic intracranial hemorrhage, unspecified: Secondary | ICD-10-CM | POA: Diagnosis not present

## 2021-01-30 DIAGNOSIS — N1832 Chronic kidney disease, stage 3b: Secondary | ICD-10-CM | POA: Diagnosis present

## 2021-01-30 DIAGNOSIS — Z85828 Personal history of other malignant neoplasm of skin: Secondary | ICD-10-CM | POA: Diagnosis not present

## 2021-01-30 DIAGNOSIS — F0390 Unspecified dementia without behavioral disturbance: Secondary | ICD-10-CM | POA: Diagnosis present

## 2021-01-30 DIAGNOSIS — K219 Gastro-esophageal reflux disease without esophagitis: Secondary | ICD-10-CM | POA: Diagnosis present

## 2021-01-30 DIAGNOSIS — I129 Hypertensive chronic kidney disease with stage 1 through stage 4 chronic kidney disease, or unspecified chronic kidney disease: Secondary | ICD-10-CM | POA: Diagnosis present

## 2021-01-30 DIAGNOSIS — F32A Depression, unspecified: Secondary | ICD-10-CM | POA: Diagnosis present

## 2021-01-30 DIAGNOSIS — Z8673 Personal history of transient ischemic attack (TIA), and cerebral infarction without residual deficits: Secondary | ICD-10-CM | POA: Diagnosis not present

## 2021-01-30 DIAGNOSIS — G9341 Metabolic encephalopathy: Secondary | ICD-10-CM | POA: Diagnosis present

## 2021-01-30 DIAGNOSIS — W1830XA Fall on same level, unspecified, initial encounter: Secondary | ICD-10-CM | POA: Diagnosis present

## 2021-01-30 DIAGNOSIS — Z20822 Contact with and (suspected) exposure to covid-19: Secondary | ICD-10-CM | POA: Diagnosis present

## 2021-01-30 LAB — CBC
HCT: 21.3 % — ABNORMAL LOW (ref 39.0–52.0)
Hemoglobin: 7.6 g/dL — ABNORMAL LOW (ref 13.0–17.0)
MCH: 35.5 pg — ABNORMAL HIGH (ref 26.0–34.0)
MCHC: 35.7 g/dL (ref 30.0–36.0)
MCV: 99.5 fL (ref 80.0–100.0)
Platelets: 106 10*3/uL — ABNORMAL LOW (ref 150–400)
RBC: 2.14 MIL/uL — ABNORMAL LOW (ref 4.22–5.81)
RDW: 19 % — ABNORMAL HIGH (ref 11.5–15.5)
WBC: 7.7 10*3/uL (ref 4.0–10.5)
nRBC: 4.8 % — ABNORMAL HIGH (ref 0.0–0.2)

## 2021-01-30 LAB — COMPREHENSIVE METABOLIC PANEL
ALT: 21 U/L (ref 0–44)
AST: 25 U/L (ref 15–41)
Albumin: 2.7 g/dL — ABNORMAL LOW (ref 3.5–5.0)
Alkaline Phosphatase: 372 U/L — ABNORMAL HIGH (ref 38–126)
Anion gap: 8 (ref 5–15)
BUN: 103 mg/dL — ABNORMAL HIGH (ref 8–23)
CO2: 19 mmol/L — ABNORMAL LOW (ref 22–32)
Calcium: 8.1 mg/dL — ABNORMAL LOW (ref 8.9–10.3)
Chloride: 118 mmol/L — ABNORMAL HIGH (ref 98–111)
Creatinine, Ser: 1.7 mg/dL — ABNORMAL HIGH (ref 0.61–1.24)
GFR, Estimated: 38 mL/min — ABNORMAL LOW (ref >60)
Glucose, Bld: 92 mg/dL (ref 70–99)
Potassium: 4.6 mmol/L (ref 3.5–5.1)
Sodium: 145 mmol/L (ref 135–145)
Total Bilirubin: 0.8 mg/dL (ref 0.3–1.2)
Total Protein: 5.6 g/dL — ABNORMAL LOW (ref 6.5–8.1)

## 2021-01-30 MED ORDER — KCL IN DEXTROSE-NACL 40-5-0.9 MEQ/L-%-% IV SOLN
INTRAVENOUS | Status: AC
  Filled 2021-01-30: qty 1000

## 2021-01-30 MED ORDER — ENSURE ENLIVE PO LIQD
237.0000 mL | Freq: Three times a day (TID) | ORAL | Status: DC
  Administered 2021-01-30 – 2021-02-06 (×16): 237 mL via ORAL

## 2021-01-30 MED ORDER — SODIUM CHLORIDE 0.9 % IV SOLN: INTRAVENOUS | Status: DC

## 2021-01-30 MED ORDER — FINASTERIDE 5 MG PO TABS
5.0000 mg | ORAL_TABLET | Freq: Every day | ORAL | Status: DC
  Administered 2021-01-30 – 2021-02-06 (×8): 5 mg via ORAL
  Filled 2021-01-30 (×7): qty 1

## 2021-01-30 MED ORDER — ADULT MULTIVITAMIN W/MINERALS CH
1.0000 | ORAL_TABLET | Freq: Every day | ORAL | Status: DC
  Administered 2021-01-30 – 2021-02-06 (×8): 1 via ORAL
  Filled 2021-01-30 (×8): qty 1

## 2021-01-30 NOTE — Care Management Obs Status (Signed)
[place] NOTIFICATION   Patient Details  Name: ANYELO MCCUE MRN: [id number] Date of Birth: [birth year]   Medicare Observation Status Notification Given:  Yes    Shelbie Hutching, RN 01/30/2021, 10:46 AM

## 2021-01-30 NOTE — Evaluation (Signed)
Occupational Therapy Evaluation Patient Details Name: Taylor Hogan MRN: [id number] DOB: 1929/08/29 Today's Date: 01/30/2021    History of Present Illness 87 y.o. male with medical history significant for prostate cancer metastatic to bone on active chemotherapy, CKD stage III, history of CVA, pancytopenia, depression/anxiety, and mild cognitive impairment who presented to the ED for evaluation of altered mental status   Clinical Impression    Patient presenting with decreased I in self care, balance, functional mobility/transfers, endurance, and safety awareness. Patient's wife and son present in room to confirm baseline. Pt ambulates with RW at home and spends most of his day in basement with his dog. He has a flight of stairs to main level in home and several steps to get in front door. Pt is very restless and impulsive during session. He attempts standing on his own from EOB and need for frequent urination.  Patient currently ambulating short distance ~ 7' into bathroom with min HHA for toileting needs. Pt needing mod A for hygiene and clothing management. He stood at sink to wash hands with mod multimodal cuing for sequencing. Pt returning to bed and therapist making room a quiet environment for rest. Pt did not sleep last night per staff. Patient will benefit from acute OT to increase overall independence in the areas of ADLs, functional mobility, and safety awareness in order to safely discharge to next venue of care.   Follow Up Recommendations  SNF;Supervision/Assistance - 24 hour    Equipment Recommendations  Other (comment) (defer to next venue of care)       Precautions / Restrictions Precautions Precautions: Fall      Mobility Bed Mobility Overal bed mobility: Needs Assistance Bed Mobility: Supine to Sit;Sit to Supine     Supine to sit: Min assist Sit to supine: Min assist   General bed mobility comments: min A for safety    Transfers Overall transfer level: Needs  assistance Equipment used: 1 person hand held assist Transfers: Sit to/from [facility] Sit to Stand: Min assist Stand pivot transfers: Min assist            Balance Overall balance assessment: Needs assistance Sitting-balance support: Feet supported;Bilateral upper extremity supported Sitting balance-Leahy Scale: Fair     Standing balance support: During functional activity Standing balance-Leahy Scale: Poor                             ADL either performed or assessed with clinical judgement   ADL Overall ADL's : Needs assistance/impaired     Grooming: Standing;Min guard;Supervision/safety                   Toilet Transfer: Minimal assistance;Regular Cabin crew- Clothing Manipulation and Hygiene: Minimal assistance;Sit to/from stand       Functional mobility during ADLs: Minimal assistance       Vision Patient Visual Report: No change from baseline Additional Comments: caregivers report prior vision loss from CVA. Will look at next session if pt able to follow commands better.            Pertinent Vitals/Pain Pain Assessment: Faces Faces Pain Scale: No hurt     Hand Dominance Right   Extremity/Trunk Assessment Upper Extremity Assessment Upper Extremity Assessment: Generalized weakness   Lower Extremity Assessment Lower Extremity Assessment: Generalized weakness   Cervical / Trunk Assessment Cervical / Trunk Assessment: Kyphotic   Communication Communication Communication: No difficulties   Cognition Arousal/Alertness: Awake/alert  Behavior During Therapy: Restless;Impulsive Overall Cognitive Status: Impaired/Different from baseline Area of Impairment: Orientation;Attention;Following commands;Safety/judgement;Awareness;Problem solving                 Orientation Level: Disoriented to;Time;Situation Current Attention Level: Sustained   Following Commands: Follows one step commands  consistently;Follows one step commands with increased time Safety/Judgement: Decreased awareness of safety;Decreased awareness of deficits Awareness: Intellectual Problem Solving: Slow processing;Difficulty sequencing;Requires verbal cues;Decreased initiation General Comments: Pt able to report being at hospital but unable to answer any other orientation questions. Caregiver reports he is normally very "sharp" at home and this is not baseline. Pt needing min - mod cuing for self care tasks to sequence and initiate. very restless during session.              Home Living Family/patient expects to be discharged to:: Private residence Living Arrangements: Spouse/significant other Available Help at Discharge: Family;Available 24 hours/day Type of Home: House Home Access: Stairs to enter [employer] of Steps: 3-4   Home Layout: One level;Laundry or work area in basement     [employer]: Occupational psychologist: [place]: Clinical cytogeneticist - 2 wheels   Additional Comments: Pt stays in basement during the day with dog. Wife calls it his "man cave"      Prior Functioning/Environment Level of Independence: Independent with assistive device(s)        Comments: use of RW for mobility within the home. Caregiver reports he does not leave home.        OT Problem List: Decreased strength;Decreased coordination;Decreased activity tolerance;Impaired balance (sitting and/or standing);Decreased knowledge of use of DME or AE;Decreased safety awareness;Decreased cognition      OT Treatment/Interventions: Self-care/ADL training;Therapeutic exercise;Balance training;Therapeutic activities;Neuromuscular education;Energy conservation;DME and/or AE instruction;Manual therapy;Patient/family education;Cognitive remediation/compensation    OT Goals(Current goals can be found in the care plan section) Acute Rehab OT Goals Patient Stated Goal: to get  therapy OT Goal Formulation: With patient/family Time For Goal Achievement: 02/13/21 Potential to Achieve Goals: Good ADL Goals Pt Will Perform Grooming: with supervision;standing Pt Will Perform Lower Body Dressing: with supervision Pt Will Transfer to Toilet: with supervision;ambulating Pt Will Perform Toileting - Clothing Manipulation and hygiene: with supervision;sit to/from stand  OT Frequency: Min 2X/week   Barriers to D/C:    none known at this time          AM-PAC OT "6 Clicks" Daily Activity     Outcome Measure Help from another person eating meals?: None Help from another person taking care of personal grooming?: A Little Help from another person toileting, which includes using toliet, bedpan, or urinal?: A Little Help from another person bathing (including washing, rinsing, drying)?: A Little Help from another person to put on and taking off regular upper body clothing?: A Little Help from another person to put on and taking off regular lower body clothing?: A Little 6 Click Score: 19   End of Session Nurse Communication: Mobility status;Other (comment) (removal of O2 -at 100% saturation on RA)  Activity Tolerance: Patient tolerated treatment well Patient left: in bed;with call bell/phone within reach  OT Visit Diagnosis: Unsteadiness on feet (R26.81);Repeated falls (R29.6);Muscle weakness (generalized) (M62.81)                Time: 9381-8299 OT Time Calculation (min): 26 min Charges:  OT General Charges $OT Visit: 1 Visit OT Evaluation $OT Eval Moderate Complexity: 1 Mod OT Treatments $Self Care/Home Management : 8-22 mins  Darleen Crocker, MS, OTR/L , CBIS ascom [phone]  01/30/21, 1:46 PM

## 2021-01-30 NOTE — Progress Notes (Addendum)
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 Continue on IVFs. Oriented to person, place  Acute on chronic subdural hematoma: no surgical intervention or f/u needed as per neuro surg. Neuro surg recs apprec  AKI on CKDIIIb: Cr is trending down from day prior. Will continue on IVFs  Thrombocytopenia: likely secondary to recent chemo. Will continue to monitor  Macrocytic anemia: likely secondary to recent chemo. Will transfuse if Hb < 7.0  Metastatic prostate cancer: w/ mets to bone. Chemo as per onco  HTN: continue to hold home dose of lisinopril secondary to AKI  Depression: severity unknown. Continue on home dose of sertraline  Mild cognitive impairment: continue w/ supportive care   DVT prophylaxis: SCDs Code Status: full Family Communication: dicussed pt's care w/ pt's family at bedside and answered their questions  Disposition Plan: depends on PT/OT recs  Level of care: Med-Surg  Status is: Observation  The patient remains OBS appropriate and will d/c before 2 midnights.  Dispo: The patient is from: Home              Anticipated d/c is to: Home vs SNF              Patient currently is not medically stable to d/c.   Difficult to place patient : unclear        Consultants:  Neuro surg   Procedures:   Antimicrobials:    Subjective: Pt c/o fatigue  Objective: Vitals:   01/29/21 2100 01/29/21 2230 01/29/21 2313 01/30/21 0423  BP: 118/65 106/60 (!) 100/58 112/71  Pulse: (!) 56 60 65 92  Resp: 20 18 16 18   Temp: 98 F (36.7 C)  (!) 97.4 F (36.3 C) 97.6 F  (36.4 C)  TempSrc: Oral     SpO2: 90% 92% 100% 100%  Weight:   52.4 kg   Height:   5' 7.01" (1.702 m)     Intake/Output Summary (Last 24 hours) at 01/30/2021 0737 Last data filed at 01/30/2021 0425 Gross per 24 hour  Intake --  Output 300 ml  Net -300 ml   Filed Weights   01/29/21 1356 01/29/21 2313  Weight: 56 kg 52.4 kg    Examination:  General exam: Appears calm and comfortable  Respiratory system: Clear to auscultation. Respiratory effort normal. Cardiovascular system: S1 & S2 +. No  rubs, gallops or clicks.  Gastrointestinal system: Abdomen is nondistended, soft and nontender. Normal bowel sounds heard. Central nervous system: Alert and oriented to person, place only. Moves all extremities  Psychiatry: Judgement and insight appear abnormal. Flat mood and affect    Data Reviewed: I have personally reviewed following labs and imaging studies  CBC: Recent Labs  Lab 01/29/21 1406 01/30/21 0359  WBC 10.9* 7.7  HGB 8.7* 7.6*  HCT 23.4* 21.3*  MCV 97.5 99.5  PLT 145* 462*   Basic Metabolic Panel: Recent Labs  Lab 01/29/21 1406 01/30/21 0359  NA 143 145  K 4.8 4.6  CL 115* 118*  CO2 19* 19*  GLUCOSE 109* 92  BUN 119* 103*  CREATININE 2.07* 1.70*  CALCIUM 8.1* 8.1*   GFR: Estimated  Creatinine Clearance: 22.3 mL/min (A) (by C-G formula based on SCr of 1.7 mg/dL (H)). Liver Function Tests: Recent Labs  Lab 01/29/21 1516 01/30/21 0359  AST 25 25  ALT 27 21  ALKPHOS 446* 372*  BILITOT 0.8 0.8  PROT 7.2 5.6*  ALBUMIN 3.4* 2.7*   No results for input(s): LIPASE, AMYLASE in the last 168 hours. No results for input(s): AMMONIA in the last 168 hours. Coagulation Profile: No results for input(s): INR, PROTIME in the last 168 hours. Cardiac Enzymes: Recent Labs  Lab 01/29/21 1516  CKTOTAL 26*   BNP (last 3 results) No results for input(s): PROBNP in the last 8760 hours. HbA1C: No results for input(s): HGBA1C in the last 72 hours. CBG: No results  for input(s): GLUCAP in the last 168 hours. Lipid Profile: No results for input(s): CHOL, HDL, LDLCALC, TRIG, CHOLHDL, LDLDIRECT in the last 72 hours. Thyroid Function Tests: No results for input(s): TSH, T4TOTAL, FREET4, T3FREE, THYROIDAB in the last 72 hours. Anemia Panel: No results for input(s): VITAMINB12, FOLATE, FERRITIN, TIBC, IRON, RETICCTPCT in the last 72 hours. Sepsis Labs: Recent Labs  Lab 01/29/21 1516  LATICACIDVEN 2.0*    Recent Results (from the past 240 hour(s))  Resp Panel by RT-PCR (Flu A&B, Covid) Nasopharyngeal Swab     Status: None   Collection Time: 01/29/21  3:16 PM   Specimen: Nasopharyngeal Swab; Nasopharyngeal(NP) swabs in vial transport medium  Result Value Ref Range Status   SARS Coronavirus 2 by RT PCR NEGATIVE NEGATIVE Final    Comment: (NOTE) SARS-CoV-2 target nucleic acids are NOT DETECTED.  The SARS-CoV-2 RNA is generally detectable in upper respiratory specimens during the acute phase of infection. The lowest concentration of SARS-CoV-2 viral copies this assay can detect is 138 copies/mL. A negative result does not preclude SARS-Cov-2 infection and should not be used as the sole basis for treatment or other patient management decisions. A negative result may occur with  improper specimen collection/handling, submission of specimen other than nasopharyngeal swab, presence of viral mutation(s) within the areas targeted by this assay, and inadequate number of viral copies(<138 copies/mL). A negative result must be combined with clinical observations, patient history, and epidemiological information. The expected result is Negative.  Fact Sheet for Patients:  EntrepreneurPulse.com.au  Fact Sheet for Healthcare Providers:  IncredibleEmployment.be  This test is no t yet approved or cleared by the [place] FDA and  has been authorized for detection and/or diagnosis of SARS-CoV-2 by FDA under an Emergency  Use Authorization (EUA). This EUA will remain  in effect (meaning this test can be used) for the duration of the COVID-19 declaration under Section 564(b)(1) of the Act, 21 U.S.C.section 360bbb-3(b)(1), unless the authorization is terminated  or revoked sooner.       Influenza A by PCR NEGATIVE NEGATIVE Final   Influenza B by PCR NEGATIVE NEGATIVE Final    Comment: (NOTE) The Xpert Xpress SARS-CoV-2/FLU/RSV plus assay is intended as an aid in the diagnosis of influenza from Nasopharyngeal swab specimens and should not be used as a sole basis for treatment. Nasal washings and aspirates are unacceptable for Xpert Xpress SARS-CoV-2/FLU/RSV testing.  Fact Sheet for Patients: EntrepreneurPulse.com.au  Fact Sheet for Healthcare Providers: IncredibleEmployment.be  This test is not yet approved or cleared by the [place] FDA and has been authorized for detection and/or diagnosis of SARS-CoV-2 by FDA under an Emergency Use Authorization (EUA). This EUA will remain in effect (meaning this test can be used) for the duration of the  COVID-19 declaration under Section 564(b)(1) of the Act, 21 U.S.C. section 360bbb-3(b)(1), unless the authorization is terminated or revoked.  Performed at [facility], [address], [place], [place] [address]          Radiology Studies: CT ABDOMEN PELVIS WO CONTRAST  Result Date: 01/29/2021 CLINICAL DATA:  Weakness, decreased p.o. intake. No bowel movement for 1 week. History of prostate cancer EXAM: CT ABDOMEN AND PELVIS WITHOUT CONTRAST TECHNIQUE: Multidetector CT imaging of the abdomen and pelvis was performed following the standard protocol without IV contrast. COMPARISON:  01/03/2021 FINDINGS: Lower chest: Included lung bases are clear.  Heart size is normal. Hepatobiliary: Stable size and appearance of multiple low-density lesions within the liver, largest of which is in the anterior left hepatic  lobe measuring approximately 4.0 cm with internal fluid density compatible with a simple cyst. Unremarkable gallbladder. No hyperdense gallstone. No biliary dilatation. Pancreas: Unremarkable. No pancreatic ductal dilatation or surrounding inflammatory changes. Spleen: Normal in size without focal abnormality. Adrenals/Urinary Tract: Unremarkable adrenal glands. Tiny vascular calcifications versus nonobstructing calculi within each kidney are unchanged. No hydronephrosis. Urinary bladder appears within normal limits. Stomach/Bowel: Small hiatal hernia. Stomach otherwise within normal limits. No dilated loops bowel to suggest obstruction. Sigmoid diverticulosis. No focal bowel wall thickening or inflammatory changes. Vascular/Lymphatic: Aortoiliac atherosclerosis without aneurysm. Circumaortic left renal vein, an anatomic variant. No abdominopelvic lymphadenopathy. Reproductive: Mildly enlarged prostate gland. Other: No free fluid. No abdominopelvic fluid collection. No pneumoperitoneum. No abdominal wall hernia. Musculoskeletal: Markedly abnormal appearance of the visualized osseous structures with extensive sclerosis most suggestive of diffuse osseous metastatic disease in the setting of known prostate cancer. No pathologic fracture identified. Degenerative disc disease of the lumbar spine. IMPRESSION: 1. No acute abdominopelvic findings. No evidence of bowel obstruction. 2. Sigmoid diverticulosis without evidence of acute diverticulitis. 3. Redemonstrated markedly abnormal appearance of the visualized osseous structures with extensive sclerosis most suggestive of diffuse osseous metastatic disease in the setting of known prostate cancer. No pathologic fracture identified. Aortic Atherosclerosis (ICD10-I70.0). Electronically Signed   By: Davina Poke D.O.   On: 01/29/2021 16:07   CT Head Wo Contrast  Result Date: 01/29/2021 CLINICAL DATA:  Weakness EXAM: CT HEAD WITHOUT CONTRAST TECHNIQUE: Contiguous axial  images were obtained from the base of the skull through the vertex without intravenous contrast. COMPARISON:  Head CT 01/29/2021 FINDINGS: Brain: Slightly decreased thickness right convexity subdural hematoma, subacute on chronic. No midline shift or other mass effect. Diffuse white matter hypoattenuation and old right posterior MCA territory infarct. Generalized volume loss. Vascular: No hyperdense vessel or unexpected calcification. Skull: Normal. Negative for fracture or focal lesion. Sinuses/Orbits: No acute finding. Other: None IMPRESSION: 1. Slightly decreased thickness of right convexity subdural hematoma, subacute on chronic. No midline shift or other mass effect. 2. 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 Electronically Signed   By: Ulyses Jarred M.D.   On: 01/29/2021 22:44   CT Head Wo Contrast  Result Date: 01/29/2021 CLINICAL DATA:  Altered mental status, weakness EXAM: CT HEAD WITHOUT CONTRAST TECHNIQUE: Contiguous axial images were obtained from the base of the skull through the vertex without intravenous contrast. COMPARISON:  MRI brain February 23, 2018 and head CT February 18, 2010 FINDINGS: Brain: Mixed density right whole hemispheric collection measuring up to 7 mm on coronal image 24. There is a hyperdense focus along the inferior aspect of the collection also seen on image 24 of the coronal sequence. Mild effacement of the parenchyma  adjacent to the collection without other findings of mass effect. Age related global parenchymal volume loss with ex vacuo dilatation of the ventricular system. Similar burden of moderate to severe chronic ischemic small vessel white matter disease. Encephalomalacia in the right posterior parietal lobe appears unchanged. Chronic lacunar type infarcts in the basal ganglia and cerebellum. No evidence of acute large vascular territory infarction, parenchymal hemorrhage, hydrocephalus, or mass lesion. Vascular: No  hyperdense vessel. Atherosclerotic calcifications of the internal carotid arteries at the skull base. Skull: Normal. Negative for fracture or focal lesion. Sinuses/Orbits: Visualized portions of the paranasal sinuses and ethmoid air cells are predominantly clear. Prior bilateral lens surgery. Other: None IMPRESSION: 1. Acute on chronic right whole hemispheric collection measuring up to 7 mm. There is a hyperdense focus along the inferior aspect of the collection which likely represents a small amount of acute blood products. Minimal local mass effect. 2. Age related global parenchymal volume loss and moderate to severe chronic ischemic small vessel white matter disease. 3. Encephalomalacia in the right posterior parietal lobe appears unchanged. Chronic lacunar type infarcts in the basal ganglia and cerebellum. These results were called by telephone at the time of interpretation on 01/29/2021 at 5:27 pm to provider Merlyn Lot , who verbally acknowledged these results. Electronically Signed   By: Dahlia Bailiff MD   On: 01/29/2021 17:28   DG Chest Portable 1 View  Result Date: 01/29/2021 CLINICAL DATA:  Weakness concern for infection. EXAM: PORTABLE CHEST 1 VIEW COMPARISON:  CT abdomen from same day. FINDINGS: The heart size and mediastinal contours are within normal limits. Both lungs are clear. Extensive sclerosis of the visualized osseous structures, suggestive of diffuse metastatic disease in this patient with known prostate cancer. IMPRESSION: 1. No acute cardiopulmonary disease. 2. Extensive sclerosis of the visualized osseous structures, suggestive of diffuse metastatic disease in this patient with known prostate cancer. Electronically Signed   By: Dahlia Bailiff MD   On: 01/29/2021 17:30        Scheduled Meds:  predniSONE  20 mg Oral Q breakfast   sertraline  50 mg Oral Daily   Continuous Infusions:  dextrose 5 % and 0.9 % NaCl with KCl 40 mEq/L       LOS: 0 days    Time spent: 34  mins     Wyvonnia Dusky, MD [facility] Pager [phone]  If 7PM-7AM, please contact night-coverage 01/30/2021, 7:37 AM

## 2021-01-30 NOTE — Progress Notes (Signed)
RN to document PIV in flowsheet prior to IVT placement is not in place.

## 2021-01-30 NOTE — TOC Initial Note (Signed)
Transition of Care [facility]) - Initial/Assessment Note    Patient Details  Name: Taylor Hogan MRN: [id number] Date of Birth: 12-15-1929  Transition of Care [facility]) CM/SW Contact:    Shelbie Hutching, RN Phone Number: 01/30/2021, 11:00 AM  Clinical Narrative:                 Patient placed under observation for encephalopathy.  RNCM met with patient's wife, Arbie Cookey, and son, Louie Casa, at the bedside.  Patient is from home with his wife and has been on a steady decline since July 4th and since starting chemo about 3 weeks ago.  Patient has been incontinent and confused at home, wife does endorse patient history of dementia but he has never been aggressive or belligerent, which he was yesterday and is the reason they called EMS and came to the hospital.  Wife would like for the patient to get strong enough to return home and would be open to short term rehab.  TOC will cont to follow and assist with discharge planning.  Medically not cleared for DC , PT and OT consults pending.  Patient does have a cane and walker at home and normally can get around and back and forth to the bathroom.   Expected Discharge Plan: Skilled Nursing Facility Barriers to Discharge: Continued Medical Work up   Patient Goals and CMS Choice Patient states their goals for this hospitalization and ongoing recovery are:: Wife would like for patient to get strong enough to return home CMS Medicare.gov Compare Post Acute Care list provided to:: Patient Represenative (must comment)    Expected Discharge Plan and Services Expected Discharge Plan: [place]   Discharge Planning Services: CM Consult Post Acute Care Choice: [place] Living arrangements for the past 2 months: Single Family Home Expected Discharge Date: 02/04/21               DME Arranged: N/A DME Agency: NA       HH Arranged: NA          Prior Living Arrangements/Services Living arrangements for the past 2 months: Single  Family Home Lives with:: Spouse Patient language and need for interpreter reviewed:: Yes Do you feel safe going back to the place where you live?: Yes      Need for Family Participation in Patient Care: Yes (Comment) (prostate cancer, altered mental status) Care giver support system in place?: Yes (comment) (wife and sons) Current home services: DME (cane and walker) Criminal Activity/Legal Involvement Pertinent to Current Situation/Hospitalization: No - Comment as needed  Activities of Daily Living Home Assistive Devices/Equipment: None ADL Screening (condition at time of admission) Patient's cognitive ability adequate to safely complete daily activities?: No Is the patient deaf or have difficulty hearing?: Yes (no hearing aids) Does the patient have difficulty seeing, even when wearing glasses/contacts?: Yes Does the patient have difficulty concentrating, remembering, or making decisions?: Yes Patient able to express need for assistance with ADLs?: Yes Does the patient have difficulty dressing or bathing?: Yes Independently performs ADLs?: No Communication: Needs assistance Is this a change from baseline?: Pre-admission baseline Dressing (OT): Needs assistance Is this a change from baseline?: Pre-admission baseline Grooming: Needs assistance Is this a change from baseline?: Pre-admission baseline Feeding: Needs assistance Is this a change from baseline?: Pre-admission baseline Bathing: Needs assistance Is this a change from baseline?: Pre-admission baseline Toileting: Needs assistance Is this a change from baseline?: Pre-admission baseline In/Out Bed: Needs assistance Is this a change from baseline?: Pre-admission baseline  Walks in Home: Needs assistance, Independent with device (comment) (refuses walker or cane) Is this a change from baseline?: Pre-admission baseline Does the patient have difficulty walking or climbing stairs?: Yes Weakness of Legs: Both Weakness of  Arms/Hands: Both  Permission Sought/Granted Permission sought to share information with : Case Manager, Family Supports Permission granted to share information with : Yes, Verbal Permission Granted  Share Information with NAME: Jabier Gauss and [place] granted to share info w AGENCY: SNF  Permission granted to share info w Relationship: wife and sons     Emotional Assessment Appearance:: Appears stated age Attitude/Demeanor/Rapport: Lethargic Affect (typically observed): Unable to Assess   Alcohol / Substance Use: Not Applicable Psych Involvement: No (comment)  Admission diagnosis:  SDH (subdural hematoma) ([place]) [S06.5X9A] AKI (acute kidney injury) ([place]) [N17.9] Altered mental status, unspecified altered mental status type [Z61.09] Acute metabolic encephalopathy [U04.54] Patient Active Problem List   Diagnosis Date Noted   Acute metabolic encephalopathy 09/81/1914   Acute kidney injury superimposed on CKD ([place]) 01/29/2021   Intracranial hematoma ([place]) 01/29/2021   Prostate cancer metastatic to multiple sites ([place]) 01/17/2021   Anxiety 12/26/2020   GERD (gastroesophageal reflux disease) 12/26/2020   Hypercholesterolemia 12/26/2020   Depression 10/19/2020   Mild cognitive impairment 02/04/2018   Seizure-like activity ([place]) 02/04/2018   History of stroke 11/26/2017   Chronic pruritus 03/31/2017   Eosinophil count raised 03/31/2017   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

## 2021-01-30 NOTE — Progress Notes (Signed)
Initial Nutrition Assessment  DOCUMENTATION CODES:  Severe malnutrition in context of acute illness/injury  INTERVENTION:  Continue current diet as ordered Ensure Enlive po TID, each supplement provides 350 kcal and 20 grams of protein Magic cup TID with meals, each supplement provides 290 kcal and 9 grams of protein MVI with minerals daily  NUTRITION DIAGNOSIS:  Severe Malnutrition (in the context of acute illness) related to decreased appetite as evidenced by moderate muscle depletion, moderate fat depletion, percent weight loss (5.8% x 1 month).  GOAL:  Patient will meet greater than or equal to 90% of their needs  MONITOR:  PO intake, Supplement acceptance, Weight trends  REASON FOR ASSESSMENT:  Malnutrition Screening Tool    ASSESSMENT:  87 y.o. male with medical history significant for prostate cancer with mets (active chemo, last firmagon infusion 7/5), CKDIII, hx of CVA, pancytopenia, depression/anxiety, and mild cognitive impairment presented to the ED for AMS and decline at home.    Pt's son reports mets were recently detected to the bowel and started chemo. Since that time, pt has been having diarrhea, fatigue, generalized weakness, and poor appetite. Being followed by oncology center RD, recently started on appetite stimulant and is drinking 1-2 nutrition supplements each day per documentation.   Pt resting in bed at the time of visit, sitter at bedside helping to reorient pt. Not able to provide a reliable hx, only oriented x 1.  Does report he likes ensure, will add TID. Messaged RN about pt's intake to determine how appetite has been today, awaiting response.   Nutritionally Relevant Medications: Scheduled Meds:  predniSONE  20 mg Oral Q breakfast   Continuous Infusions:  sodium chloride 75 mL/hr at 01/30/21 1455   PRN Meds: loperamide, ondansetron  Labs Reviewed: BUN 103, creatinine 1.7  NUTRITION - FOCUSED PHYSICAL EXAM: Flowsheet Row Most Recent Value   Orbital Region Moderate depletion  Upper Arm Region Mild depletion  Thoracic and Lumbar Region Moderate depletion  Buccal Region Moderate depletion  Temple Region Moderate depletion  Clavicle Bone Region Mild depletion  Clavicle and Acromion Bone Region Mild depletion  Scapular Bone Region Mild depletion  Dorsal Hand Moderate depletion  Patellar Region Severe depletion  Anterior Thigh Region Severe depletion  Posterior Calf Region Severe depletion  Edema (RD Assessment) None  Hair Reviewed  Eyes Reviewed  Mouth Reviewed  Skin Reviewed  Nails Reviewed   Diet Order:   Diet Order             Diet regular Room service appropriate? Yes; Fluid consistency: Thin  Diet effective now                   EDUCATION NEEDS:  Not appropriate for education at this time  Skin:  Skin Assessment: Reviewed RN Assessment  Last BM:  7/13 - type 7  Height:  Ht Readings from Last 1 Encounters:  01/29/21 5' 7.01" (1.702 m)    Weight:  Wt Readings from Last 1 Encounters:  01/29/21 52.4 kg    Ideal Body Weight:  67.3 kg  BMI:  Body mass index is 18.09 kg/m.  Estimated Nutritional Needs:  Kcal:  1600-1800 kcal/d Protein:  85-100g/d Fluid:  1.8-2 L/d   Ranell Patrick, RD, LDN Clinical Dietitian Pager on [place]

## 2021-01-30 NOTE — Progress Notes (Signed)
Writer has notified provider Onalee Hua in regards to patients IV fluids expiring at 0600.  Awaiting clarification if patient will continue with IV fluids as they were not given in ED.

## 2021-01-30 NOTE — Progress Notes (Signed)
   01/30/21 1256  Assess: MEWS Score  Temp (!) 97.5 F (36.4 C)  BP (!) 90/56  Pulse Rate (!) 104  Resp 17  Level of Consciousness Alert  SpO2 100 %  O2 Device Room Air  Yellow MEWS flagged. VS reassessed with same result. IV fluids have been started. Discussed with charge nurse Debi. MEWS protocol is been implemented. Will continue to monitor

## 2021-01-30 NOTE — Consult Note (Signed)
Referring Physician:  No referring provider defined for this encounter.  Primary Physician:  System, Provider Not In  Chief Complaint:  altered mental status, dehydration  The patient is unable to give history - level 5 qualifier.  History of Present Illness: 01/30/2021 Taylor Hogan is a 87 y.o. male who presents with the chief complaint of altered mental status.    His history is gathered from the chart.  He has had several weeks of functional decline, and was recently started on chemotherapy for metastatic prostate CA.  He is able to ambulate but it is with significant balance issues.   He participates in exam today, but cannot provide full history.  During workup, a small subdural hematoma was found.    Review of Systems:  A 10 point review of systems is negative, except for the pertinent positives and negatives detailed in the HPI.  Past Medical History: Past Medical History:  Diagnosis Date   Anxiety    Cancer ([place])    skin   Cataract    Depression     Past Surgical History: Past Surgical History:  Procedure Laterality Date   CATARACT EXTRACTION  2012   polyp removal     TONSILLECTOMY      Allergies: Allergies as of 01/29/2021 - Review Complete 01/29/2021  Allergen Reaction Noted   Amoxicillin Diarrhea 03/16/2014   Lansoprazole Diarrhea 03/16/2014   Sulfa antibiotics Nausea And Vomiting 01/08/2016    Medications:  Current Facility-Administered Medications:    acetaminophen (TYLENOL) tablet 650 mg, 650 mg, Oral, Q6H PRN **OR** acetaminophen (TYLENOL) suppository 650 mg, 650 mg, Rectal, Q6H PRN, Posey Pronto, Vishal R, MD   dextrose 5 % and 0.9 % NaCl with KCl 40 mEq/L infusion, , Intravenous, Continuous, Damita Dunnings, Hazel V, MD   haloperidol lactate (HALDOL) injection 1 mg, 1 mg, Intramuscular, Q6H PRN, Posey Pronto, Vishal R, MD   loperamide (IMODIUM) capsule 2 mg, 2 mg, Oral, PRN, Posey Pronto, Vishal R, MD   ondansetron (ZOFRAN) tablet 4 mg, 4 mg, Oral, Q6H PRN **OR**  ondansetron (ZOFRAN) injection 4 mg, 4 mg, Intravenous, Q6H PRN, Posey Pronto, Vishal R, MD   predniSONE (DELTASONE) tablet 20 mg, 20 mg, Oral, Q breakfast, Patel, Vishal R, MD   sertraline (ZOLOFT) tablet 50 mg, 50 mg, Oral, Daily, Lenore Cordia, MD   Social History: Social History   Tobacco Use   Smoking status: Former    Pack years: 0.00   Smokeless tobacco: Former    Types: Snuff  Substance Use Topics   Alcohol use: No   Drug use: No    Family Medical History: Family History  Problem Relation Age of Onset   Emphysema Mother     Physical Examination: Vitals:   01/29/21 2313 01/30/21 0423  BP: (!) 100/58 112/71  Pulse: 65 92  Resp: 16 18  Temp: (!) 97.4 F (36.3 C) 97.6 F (36.4 C)  SpO2: 100% 100%     General: Patient is well developed, well nourished, calm, collected, and in no apparent distress.  Psychiatric: Patient is non-anxious.  Head:  Pupils equal, round, and reactive to light.  ENT:  Oral mucosa appears well hydrated.  Neck:   Supple.  Full range of motion.  Respiratory: Patient is breathing on [place].  Extremities: No edema.  Vascular: Palpable pulses in dorsal pedal vessels.  Skin:   On exposed skin, there are some skin lesions but no lacerations.  NEUROLOGICAL:  General: In no acute distress.   Awake, alert, oriented to person. He cannot  say place or time.  Pupils equal round and reactive to light.  Facial tone is symmetric.  Tongue protrusion is midline.  There is no pronator drift.   Strength: Side Biceps Triceps Deltoid Interossei Grip Wrist Ext. Wrist Flex.  R 5 5 5 5 5 5 5   L 5 5 5 5 5 5 5    Side Iliopsoas Quads Hamstring PF DF EHL  R 5 5 5 5 5 5   L 5 5 5 5 5 5     Bilateral upper and lower extremity sensation is intact to light touch. Reflexes are 1+ and symmetric at the biceps, triceps, brachioradialis, patella and achilles. Hoffman's is absent.  Clonus is not present.  Toes are down-going.    Gait is untested.   Imaging: CT  Head 01/29/21 IMPRESSION: 1. Slightly decreased thickness of right convexity subdural hematoma, subacute on chronic. No midline shift or other mass effect. 2. 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     Electronically Signed   By: Ulyses Jarred M.D.   On: 01/29/2021 22:44  I have personally reviewed the images and agree with the above interpretation.  Labs: CBC Latest Ref Rng & Units 01/30/2021 01/29/2021 01/17/2021  WBC 4.0 - 10.5 K/uL 7.7 10.9(H) 5.3  Hemoglobin 13.0 - 17.0 g/dL 7.6(L) 8.7(L) 6.7(L)  Hematocrit 39.0 - 52.0 % 21.3(L) 23.4(L) 21.7(L)  Platelets 150 - 400 K/uL 106(L) 145(L) 95(L)       Assessment and Plan: Taylor Hogan is a pleasant 87 y.o. male with small R subdural hematoma.  He has multiple other issues currently, and was recently diagnosed with metastatic prostate cancer.  - No surgical intervention for his subdural hematoma. - Consider palliative care per primary team - Given currently functional decline and likely palliative care referral, no follow up necessary.   Malcome Ambrocio K. Izora Ribas MD, [place] [facility]

## 2021-01-30 NOTE — Evaluation (Signed)
Physical Therapy Evaluation Patient Details Name: Taylor Hogan MRN: [id number] DOB: [birth year] Today's Date: 01/30/2021   History of Present Illness  Pt is an 87 y.o. male admitted to hospital 7/12 with decreased p.o. intake, weakness, and increased agitation at home.  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  PMH includes anxiety, skin CA, prostate CA on chemotherapy, metastatic disease to bone, CKD, seizure like activity, and h/o stroke.  Clinical Impression  Prior to hospital admission, pt was modified independent with functional mobility; lives with his wife on main level of home.  Currently pt is oriented only to self (pt reports having difficulty remembering things); impulsivity noted but pt able to be re-directed fairly quickly.  SBA semi-supine to sitting edge of bed; min assist with transfers; and min assist (hand hold assist) ambulating 20 feet within room (pt unsteady requiring assist for balance).  No c/o pain during sessions activities.   Pt would benefit from skilled PT to address noted impairments and functional limitations (see below for any additional details).  Upon hospital discharge, pt would benefit from SNF.    Follow Up Recommendations SNF    Equipment Recommendations  Rolling walker with 5" wheels;3in1 (PT)    Recommendations for Other Services OT consult     Precautions / Restrictions Precautions Precautions: Fall Precaution Comments: Metastatic disease to bones; small R SDH Restrictions Weight Bearing Restrictions: No      Mobility  Bed Mobility Overal bed mobility: Needs Assistance Bed Mobility: Supine to Sit     Supine to sit: Supervision   General bed mobility comments: SBA for safety     Transfers Overall transfer level: Needs assistance Equipment used: 1 person hand held assist Transfers: Sit to/from Stand Sit to Stand: Min assist Stand pivot transfers: Min assist       General transfer comment: assist to steady; vc's for safety  Ambulation/Gait Ambulation/Gait assistance: Min assist Gait Distance (Feet): 20 Feet Assistive device: 1 person hand held assist   Gait velocity: decreased   General Gait Details: decreased B LE step length; pt intermittently holding onto furniture in room; assist to steady  Stairs            Wheelchair Mobility    Modified Rankin (Stroke Patients Only)       Balance Overall balance assessment: Needs assistance Sitting-balance support: No upper extremity supported;Feet supported Sitting balance-Leahy Scale: Good Sitting balance - Comments: steady sitting reaching within BOS   Standing balance support: No upper extremity supported;During functional activity Standing balance-Leahy Scale: Poor Standing balance comment: assist for balance with walking                             Pertinent Vitals/Pain Pain Assessment: No/denies pain Faces Pain Scale: No hurt Pain Intervention(s): Limited activity within patient's tolerance;Monitored during session;Repositioned Vitals (HR and O2 on room air) stable and WFL throughout treatment session.    Home Living Family/patient expects to be discharged to:: Private residence Living Arrangements: Spouse/significant other Available Help at Discharge: Family;Available 24 hours/day Type of Home: House Home Access: Stairs to enter   [employer] of Steps: 3-4 Home Layout: One level;Laundry or work area in [place]: Clinical cytogeneticist - 2 wheels Additional Comments: Per OT eval "Pt stays in basement during the day  with dog. Wife calls it his "man cave". "    Prior Function Level of Independence: Independent with assistive device(s)          Comments: Ambulates with RW within home.     Hand Dominance   Dominant Hand: Right    Extremity/Trunk Assessment   Upper Extremity Assessment Upper Extremity Assessment: Generalized weakness    Lower Extremity Assessment Lower Extremity Assessment: Generalized weakness    Cervical / Trunk Assessment Cervical / Trunk Assessment: Kyphotic  Communication   Communication: No difficulties  Cognition Arousal/Alertness: Awake/alert Behavior During Therapy: Impulsive Overall Cognitive Status: Impaired/Different from baseline Area of Impairment: Orientation;Attention;Following commands;Safety/judgement;Awareness;Problem solving;Memory                 Orientation Level: Disoriented to;Place;Time;Situation Current Attention Level: Sustained   Following Commands: Follows one step commands consistently;Follows one step commands with increased time Safety/Judgement: Decreased awareness of safety;Decreased awareness of deficits Awareness: Intellectual Problem Solving: Slow processing;Difficulty sequencing;Requires verbal cues;Decreased initiation General Comments: Pt reports not being able to think clearly today and remember things he usually knows.      General Comments  Nursing cleared pt for participation in physical therapy.  Pt agreeable to PT session.    Exercises     Assessment/Plan    PT Assessment Patient needs continued PT services  PT Problem List Decreased strength;Decreased activity tolerance;Decreased balance;Decreased mobility;Decreased cognition;Decreased knowledge of use of DME;Decreased safety awareness;Decreased knowledge of precautions       PT Treatment Interventions DME instruction;Gait training;Stair training;Functional mobility training;Therapeutic activities;Therapeutic exercise;Balance training;Patient/family education    PT Goals (Current goals can be found in the Care Plan section)  Acute Rehab PT Goals Patient Stated Goal: to improve  memory and mobility PT Goal Formulation: With patient Time For Goal Achievement: 02/13/21 Potential to Achieve Goals: Good    Frequency 7X/week   Barriers to discharge Decreased caregiver support      Co-evaluation               AM-PAC PT "6 Clicks" Mobility  Outcome Measure Help needed turning from your back to your side while in a flat bed without using bedrails?: None Help needed moving from lying on your back to sitting on the side of a flat bed without using bedrails?: A Little Help needed moving to and from a bed to a chair (including a wheelchair)?: A Little Help needed standing up from a chair using your arms (e.g., wheelchair or bedside chair)?: A Little Help needed to walk in hospital room?: A Little Help needed climbing 3-5 steps with a railing? : A Lot 6 Click Score: 18    End of Session Equipment Utilized During Treatment: Gait belt Activity Tolerance: Patient tolerated treatment well Patient left: with call bell/phone within reach (sitting on edge of bed drinking beverage; sitter sitting next to pt) Nurse Communication: Mobility status;Precautions PT Visit Diagnosis: Unsteadiness on feet (R26.81);Other abnormalities of gait and mobility (R26.89);Muscle weakness (generalized) (M62.81)    Time: 4287-6811 PT Time Calculation (min) (ACUTE ONLY): 12 min   Charges:   PT Evaluation $PT Eval Low Complexity: 1 Low         Enzley Kitchens, PT 01/30/21, 1:51 PM

## 2021-01-31 DIAGNOSIS — I629 Nontraumatic intracranial hemorrhage, unspecified: Secondary | ICD-10-CM

## 2021-01-31 DIAGNOSIS — E43 Unspecified severe protein-calorie malnutrition: Secondary | ICD-10-CM | POA: Insufficient documentation

## 2021-01-31 LAB — URINALYSIS, COMPLETE (UACMP) WITH MICROSCOPIC
Bilirubin Urine: NEGATIVE
Glucose, UA: NEGATIVE mg/dL
Hgb urine dipstick: NEGATIVE
Ketones, ur: NEGATIVE mg/dL
Leukocytes,Ua: NEGATIVE
Nitrite: NEGATIVE
Protein, ur: NEGATIVE mg/dL
Specific Gravity, Urine: 1.015 (ref 1.005–1.030)
Squamous Epithelial / HPF: NONE SEEN (ref 0–5)
pH: 5 (ref 5.0–8.0)

## 2021-01-31 LAB — BASIC METABOLIC PANEL
Anion gap: 7 (ref 5–15)
BUN: 71 mg/dL — ABNORMAL HIGH (ref 8–23)
CO2: 18 mmol/L — ABNORMAL LOW (ref 22–32)
Calcium: 8.1 mg/dL — ABNORMAL LOW (ref 8.9–10.3)
Chloride: 119 mmol/L — ABNORMAL HIGH (ref 98–111)
Creatinine, Ser: 1.45 mg/dL — ABNORMAL HIGH (ref 0.61–1.24)
GFR, Estimated: 46 mL/min — ABNORMAL LOW (ref >60)
Glucose, Bld: 102 mg/dL — ABNORMAL HIGH (ref 70–99)
Potassium: 4.7 mmol/L (ref 3.5–5.1)
Sodium: 144 mmol/L (ref 135–145)

## 2021-01-31 LAB — CBC
HCT: 25.6 % — ABNORMAL LOW (ref 39.0–52.0)
Hemoglobin: 8.2 g/dL — ABNORMAL LOW (ref 13.0–17.0)
MCH: 30 pg (ref 26.0–34.0)
MCHC: 32 g/dL (ref 30.0–36.0)
MCV: 93.8 fL (ref 80.0–100.0)
Platelets: 110 10*3/uL — ABNORMAL LOW (ref 150–400)
RBC: 2.73 MIL/uL — ABNORMAL LOW (ref 4.22–5.81)
RDW: 18.6 % — ABNORMAL HIGH (ref 11.5–15.5)
WBC: 11.2 10*3/uL — ABNORMAL HIGH (ref 4.0–10.5)
nRBC: 2.9 % — ABNORMAL HIGH (ref 0.0–0.2)

## 2021-01-31 LAB — HEMOGLOBIN AND HEMATOCRIT, BLOOD
HCT: 24.1 % — ABNORMAL LOW (ref 39.0–52.0)
Hemoglobin: 8 g/dL — ABNORMAL LOW (ref 13.0–17.0)

## 2021-01-31 NOTE — Progress Notes (Signed)
Patient received haldol x 1 during night due to severe agitation.   Urinalysis collected and sent to lab because Patient  was complaining of burning pain when he has to void.  MD Mansy aware.  No other pertinent findings noted.

## 2021-01-31 NOTE — Progress Notes (Signed)
PT Cancellation Note  Patient Details Name: Taylor Hogan MRN: [id number] DOB: [birth year]   Cancelled Treatment:    Reason Eval/Treat Not Completed: Other (comment). Patient severely agitated during the night, given haldol. Patient continues to be agitated, sitter present in room who states he just went to sleep. Hold PT for now, will re-attempt later as appropriate.    Marytza Grandpre 01/31/2021, 11:53 AM

## 2021-01-31 NOTE — Progress Notes (Signed)
   01/30/21 1256  Assess: MEWS Score  Temp (!) 97.5 F (36.4 C)  BP (!) 90/56  Pulse Rate (!) 104  Resp 17  Level of Consciousness Alert  SpO2 100 %  O2 Device Room Air  Assess: MEWS Score  MEWS Temp 0  MEWS Systolic 1  MEWS Pulse 1  MEWS RR 0  MEWS LOC 0  MEWS Score 2  MEWS Score Color Yellow  Assess: if the MEWS score is Yellow or Red  Were vital signs taken at a resting state? Yes  Focused Assessment Change from prior assessment (see assessment flowsheet)  Does the patient meet 2 or more of the SIRS criteria? Yes  Does the patient have a confirmed or suspected source of infection? No  MEWS guidelines implemented *See Row Information* Yes  Treat  MEWS Interventions Escalated (See documentation below);Administered scheduled meds/treatments (Starting IV fluids)  Pain Scale 0-10  Pain Score 0  Take Vital Signs  Increase Vital Sign Frequency  Yellow: Q 2hr X 2 then Q 4hr X 2, if remains yellow, continue Q 4hrs  Escalate  MEWS: Escalate Yellow: discuss with charge nurse/RN and consider discussing with provider and RRT  Notify: Charge Nurse/RN  Name of Charge Nurse/RN Notified Debi  Date Charge Nurse/RN Notified 01/30/21  Time Charge Nurse/RN Notified 1303  Document  Progress note created (see row info) Yes  Assess: SIRS CRITERIA  SIRS Temperature  0  SIRS Pulse 1  SIRS Respirations  0  SIRS WBC 0  SIRS Score Sum  1

## 2021-01-31 NOTE — TOC Progression Note (Signed)
Transition of Care [facility]) - Progression Note    Patient Details  Name: Taylor Hogan MRN: [id number] Date of Birth: 03/27/1930  [facility]) CM/SW Contact  Shelbie Hutching, RN Phone Number: 01/31/2021, 4:23 PM  Clinical Narrative:    Pasrr [phone] [id number]   Expected Discharge Plan: [place] Barriers to Discharge: Continued Medical Work up  Expected Discharge Plan and Services Expected Discharge Plan: [place]   Discharge Planning Services: CM Consult Post Acute Care Choice: [place] Living arrangements for the past 2 months: Single Family Home Expected Discharge Date: 02/04/21               DME Arranged: N/A DME Agency: NA       HH Arranged: NA           Social Determinants of Health (SDOH) Interventions    Readmission Risk Interventions No flowsheet data found.

## 2021-01-31 NOTE — TOC Progression Note (Signed)
Transition of Care [facility]) - Progression Note    Patient Details  Name: KADEEM HYLE MRN: [id number] Date of Birth: 11/18/1929  [facility]) CM/SW Contact  Shelbie Hutching, RN Phone Number: 01/31/2021, 3:47 PM  Clinical Narrative:    No bed offers at this time, bed search expanded.  Pasrr still pending.    Expected Discharge Plan: [place] Barriers to Discharge: Continued Medical Work up  Expected Discharge Plan and Services Expected Discharge Plan: [place]   Discharge Planning Services: CM Consult Post Acute Care Choice: [place] Living arrangements for the past 2 months: Single Family Home Expected Discharge Date: 02/04/21               DME Arranged: N/A DME Agency: NA       HH Arranged: NA           Social Determinants of Health (SDOH) Interventions    Readmission Risk Interventions No flowsheet data found.

## 2021-01-31 NOTE — Progress Notes (Signed)
PROGRESS NOTE    Taylor Hogan  IZT:[id number] DOB: [birth year] DOA: 01/29/2021 PCP: System, Provider Not In    Assessment & Plan:   Principal Problem:   Acute metabolic encephalopathy Active Problems:   Mild cognitive impairment   Prostate cancer metastatic to multiple sites [facility]   Acute kidney injury superimposed on CKD ([place])   Intracranial hematoma (HCC)   Intracranial hemorrhage (HCC)   Protein-calorie malnutrition, severe   Acute metabolic encephalopathy: likely secondary intracranial hemorrhage. D/c IVFs. Oriented to person, place  Acute on chronic subdural hematoma: no surgical intervention or f/u needed as per neuro surg. Neuro surg recs apprec  AKI on CKDIIIb: Cr continues to trend down daily.   Thrombocytopenia: likely secondary to recent chemo. No need for a transfusion currently   Macrocytic anemia: H&H are labile. Likely secondary to recent chemo.   Metastatic prostate cancer: w/ mets to bone. Chemo as per onco  HTN: continue to hold home dose of lisinopril as BP is low end of normal   Depression: severity unknown. Continue on home dose of sertraline   Mild cognitive impairment: continue w/ supportive care   DVT prophylaxis: SCDs Code Status: full Family Communication: called pt's son, Gerald Stabs, but no answer Disposition Plan: likely d/c to SNF   Level of care: Med-Surg  Status is: Inpatient  Remains inpatient appropriate because:Inpatient level of care appropriate due to severity of illness  Dispo: The patient is from: Home              Anticipated d/c is to: SNF              Patient currently : is medically stable for d/c   Difficult to place patient : unclear   Consultants:  Neuro surg   Procedures:   Antimicrobials:    Subjective: Pt c/o malaise  Objective: Vitals:   01/30/21 1712 01/30/21 1954 01/30/21 2311 01/31/21 0441  BP: 109/64 107/61 117/68 119/70  Pulse: 96 (!) 104 (!) 108 92  Resp: 16 16 20 16   Temp: 97.7 F (36.5 C)  (!) 97.4 F (36.3 C) (!) 97.5 F (36.4 C) 99 F (37.2 C)  TempSrc:  Oral Oral   SpO2: 100% 100% 100% 100%  Weight:      Height:        Intake/Output Summary (Last 24 hours) at 01/31/2021 0732 Last data filed at 01/31/2021 0521 Gross per 24 hour  Intake 964.99 ml  Output 520 ml  Net 444.99 ml   Filed Weights   01/29/21 1356 01/29/21 2313  Weight: 56 kg 52.4 kg    Examination:  General exam: Appears restless  Respiratory system: clear breath sounds b/l  Cardiovascular system: S1/S2+. No rubs or clicks  Gastrointestinal system: Abd is soft, NT, ND & hypoactive bowel sounds Central nervous system: Alert to person, place only. Moves all extremities  Psychiatry: Judgement and insight appears abnormal. Flat mood and affect    Data Reviewed: I have personally reviewed following labs and imaging studies  CBC: Recent Labs  Lab 01/29/21 1406 01/30/21 0359  WBC 10.9* 7.7  HGB 8.7* 7.6*  HCT 23.4* 21.3*  MCV 97.5 99.5  PLT 145* 382*   Basic Metabolic Panel: Recent Labs  Lab 01/29/21 1406 01/30/21 0359  NA 143 145  K 4.8 4.6  CL 115* 118*  CO2 19* 19*  GLUCOSE 109* 92  BUN 119* 103*  CREATININE 2.07* 1.70*  CALCIUM 8.1* 8.1*   GFR: Estimated Creatinine Clearance: 22.3 mL/min (A) (by C-G formula  based on SCr of 1.7 mg/dL (H)). Liver Function Tests: Recent Labs  Lab 01/29/21 1516 01/30/21 0359  AST 25 25  ALT 27 21  ALKPHOS 446* 372*  BILITOT 0.8 0.8  PROT 7.2 5.6*  ALBUMIN 3.4* 2.7*   No results for input(s): LIPASE, AMYLASE in the last 168 hours. No results for input(s): AMMONIA in the last 168 hours. Coagulation Profile: No results for input(s): INR, PROTIME in the last 168 hours. Cardiac Enzymes: Recent Labs  Lab 01/29/21 1516  CKTOTAL 26*   BNP (last 3 results) No results for input(s): PROBNP in the last 8760 hours. HbA1C: No results for input(s): HGBA1C in the last 72 hours. CBG: No results for input(s): GLUCAP in the last 168 hours. Lipid  Profile: No results for input(s): CHOL, HDL, LDLCALC, TRIG, CHOLHDL, LDLDIRECT in the last 72 hours. Thyroid Function Tests: No results for input(s): TSH, T4TOTAL, FREET4, T3FREE, THYROIDAB in the last 72 hours. Anemia Panel: No results for input(s): VITAMINB12, FOLATE, FERRITIN, TIBC, IRON, RETICCTPCT in the last 72 hours. Sepsis Labs: Recent Labs  Lab 01/29/21 1516  LATICACIDVEN 2.0*    Recent Results (from the past 240 hour(s))  Resp Panel by RT-PCR (Flu A&B, Covid) Nasopharyngeal Swab     Status: None   Collection Time: 01/29/21  3:16 PM   Specimen: Nasopharyngeal Swab; Nasopharyngeal(NP) swabs in vial transport medium  Result Value Ref Range Status   SARS Coronavirus 2 by RT PCR NEGATIVE NEGATIVE Final    Comment: (NOTE) SARS-CoV-2 target nucleic acids are NOT DETECTED.  The SARS-CoV-2 RNA is generally detectable in upper respiratory specimens during the acute phase of infection. The lowest concentration of SARS-CoV-2 viral copies this assay can detect is 138 copies/mL. A negative result does not preclude SARS-Cov-2 infection and should not be used as the sole basis for treatment or other patient management decisions. A negative result may occur with  improper specimen collection/handling, submission of specimen other than nasopharyngeal swab, presence of viral mutation(s) within the areas targeted by this assay, and inadequate number of viral copies(<138 copies/mL). A negative result must be combined with clinical observations, patient history, and epidemiological information. The expected result is Negative.  Fact Sheet for Patients:  EntrepreneurPulse.com.au  Fact Sheet for Healthcare Providers:  IncredibleEmployment.be  This test is no t yet approved or cleared by the [place] FDA and  has been authorized for detection and/or diagnosis of SARS-CoV-2 by FDA under an Emergency Use Authorization (EUA). This EUA will remain  in  effect (meaning this test can be used) for the duration of the COVID-19 declaration under Section 564(b)(1) of the Act, 21 U.S.C.section 360bbb-3(b)(1), unless the authorization is terminated  or revoked sooner.       Influenza A by PCR NEGATIVE NEGATIVE Final   Influenza B by PCR NEGATIVE NEGATIVE Final    Comment: (NOTE) The Xpert Xpress SARS-CoV-2/FLU/RSV plus assay is intended as an aid in the diagnosis of influenza from Nasopharyngeal swab specimens and should not be used as a sole basis for treatment. Nasal washings and aspirates are unacceptable for Xpert Xpress SARS-CoV-2/FLU/RSV testing.  Fact Sheet for Patients: EntrepreneurPulse.com.au  Fact Sheet for Healthcare Providers: IncredibleEmployment.be  This test is not yet approved or cleared by the [place] FDA and has been authorized for detection and/or diagnosis of SARS-CoV-2 by FDA under an Emergency Use Authorization (EUA). This EUA will remain in effect (meaning this test can be used) for the duration of the COVID-19 declaration under Section 564(b)(1) of the Act,  21 U.S.C. section 360bbb-3(b)(1), unless the authorization is terminated or revoked.  Performed at [facility], [address], [place], [place] [address]          Radiology Studies: CT ABDOMEN PELVIS WO CONTRAST  Result Date: 01/29/2021 CLINICAL DATA:  Weakness, decreased p.o. intake. No bowel movement for 1 week. History of prostate cancer EXAM: CT ABDOMEN AND PELVIS WITHOUT CONTRAST TECHNIQUE: Multidetector CT imaging of the abdomen and pelvis was performed following the standard protocol without IV contrast. COMPARISON:  01/03/2021 FINDINGS: Lower chest: Included lung bases are clear.  Heart size is normal. Hepatobiliary: Stable size and appearance of multiple low-density lesions within the liver, largest of which is in the anterior left hepatic lobe measuring approximately 4.0 cm with internal  fluid density compatible with a simple cyst. Unremarkable gallbladder. No hyperdense gallstone. No biliary dilatation. Pancreas: Unremarkable. No pancreatic ductal dilatation or surrounding inflammatory changes. Spleen: Normal in size without focal abnormality. Adrenals/Urinary Tract: Unremarkable adrenal glands. Tiny vascular calcifications versus nonobstructing calculi within each kidney are unchanged. No hydronephrosis. Urinary bladder appears within normal limits. Stomach/Bowel: Small hiatal hernia. Stomach otherwise within normal limits. No dilated loops bowel to suggest obstruction. Sigmoid diverticulosis. No focal bowel wall thickening or inflammatory changes. Vascular/Lymphatic: Aortoiliac atherosclerosis without aneurysm. Circumaortic left renal vein, an anatomic variant. No abdominopelvic lymphadenopathy. Reproductive: Mildly enlarged prostate gland. Other: No free fluid. No abdominopelvic fluid collection. No pneumoperitoneum. No abdominal wall hernia. Musculoskeletal: Markedly abnormal appearance of the visualized osseous structures with extensive sclerosis most suggestive of diffuse osseous metastatic disease in the setting of known prostate cancer. No pathologic fracture identified. Degenerative disc disease of the lumbar spine. IMPRESSION: 1. No acute abdominopelvic findings. No evidence of bowel obstruction. 2. Sigmoid diverticulosis without evidence of acute diverticulitis. 3. Redemonstrated markedly abnormal appearance of the visualized osseous structures with extensive sclerosis most suggestive of diffuse osseous metastatic disease in the setting of known prostate cancer. No pathologic fracture identified. Aortic Atherosclerosis (ICD10-I70.0). Electronically Signed   By: Davina Poke D.O.   On: 01/29/2021 16:07   CT Head Wo Contrast  Result Date: 01/29/2021 CLINICAL DATA:  Weakness EXAM: CT HEAD WITHOUT CONTRAST TECHNIQUE: Contiguous axial images were obtained from the base of the skull  through the vertex without intravenous contrast. COMPARISON:  Head CT 01/29/2021 FINDINGS: Brain: Slightly decreased thickness right convexity subdural hematoma, subacute on chronic. No midline shift or other mass effect. Diffuse white matter hypoattenuation and old right posterior MCA territory infarct. Generalized volume loss. Vascular: No hyperdense vessel or unexpected calcification. Skull: Normal. Negative for fracture or focal lesion. Sinuses/Orbits: No acute finding. Other: None IMPRESSION: 1. Slightly decreased thickness of right convexity subdural hematoma, subacute on chronic. No midline shift or other mass effect. 2. 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 Electronically Signed   By: Ulyses Jarred M.D.   On: 01/29/2021 22:44   CT Head Wo Contrast  Result Date: 01/29/2021 CLINICAL DATA:  Altered mental status, weakness EXAM: CT HEAD WITHOUT CONTRAST TECHNIQUE: Contiguous axial images were obtained from the base of the skull through the vertex without intravenous contrast. COMPARISON:  MRI brain February 23, 2018 and head CT February 18, 2010 FINDINGS: Brain: Mixed density right whole hemispheric collection measuring up to 7 mm on coronal image 24. There is a hyperdense focus along the inferior aspect of the collection also seen on image 24 of the coronal sequence. Mild effacement of the parenchyma adjacent to the collection without other findings of  mass effect. Age related global parenchymal volume loss with ex vacuo dilatation of the ventricular system. Similar burden of moderate to severe chronic ischemic small vessel white matter disease. Encephalomalacia in the right posterior parietal lobe appears unchanged. Chronic lacunar type infarcts in the basal ganglia and cerebellum. No evidence of acute large vascular territory infarction, parenchymal hemorrhage, hydrocephalus, or mass lesion. Vascular: No hyperdense vessel. Atherosclerotic calcifications of the  internal carotid arteries at the skull base. Skull: Normal. Negative for fracture or focal lesion. Sinuses/Orbits: Visualized portions of the paranasal sinuses and ethmoid air cells are predominantly clear. Prior bilateral lens surgery. Other: None IMPRESSION: 1. Acute on chronic right whole hemispheric collection measuring up to 7 mm. There is a hyperdense focus along the inferior aspect of the collection which likely represents a small amount of acute blood products. Minimal local mass effect. 2. Age related global parenchymal volume loss and moderate to severe chronic ischemic small vessel white matter disease. 3. Encephalomalacia in the right posterior parietal lobe appears unchanged. Chronic lacunar type infarcts in the basal ganglia and cerebellum. These results were called by telephone at the time of interpretation on 01/29/2021 at 5:27 pm to provider Merlyn Lot , who verbally acknowledged these results. Electronically Signed   By: Dahlia Bailiff MD   On: 01/29/2021 17:28   DG Chest Portable 1 View  Result Date: 01/29/2021 CLINICAL DATA:  Weakness concern for infection. EXAM: PORTABLE CHEST 1 VIEW COMPARISON:  CT abdomen from same day. FINDINGS: The heart size and mediastinal contours are within normal limits. Both lungs are clear. Extensive sclerosis of the visualized osseous structures, suggestive of diffuse metastatic disease in this patient with known prostate cancer. IMPRESSION: 1. No acute cardiopulmonary disease. 2. Extensive sclerosis of the visualized osseous structures, suggestive of diffuse metastatic disease in this patient with known prostate cancer. Electronically Signed   By: Dahlia Bailiff MD   On: 01/29/2021 17:30        Scheduled Meds:  feeding supplement  237 mL Oral TID BM   finasteride  5 mg Oral Daily   multivitamin with minerals  1 tablet Oral Daily   predniSONE  20 mg Oral Q breakfast   sertraline  50 mg Oral Daily   Continuous Infusions:  sodium chloride 75  mL/hr at 01/30/21 1455     LOS: 1 day    Time spent: 30 mins     Wyvonnia Dusky, MD [facility] Pager [phone]  If 7PM-7AM, please contact night-coverage 01/31/2021, 7:32 AM

## 2021-01-31 NOTE — NC FL2 (Signed)
[place] LEVEL OF CARE SCREENING TOOL     IDENTIFICATION  Patient Name: Taylor Hogan Birthdate: [birth year] Sex: male Admission Date (Current Location): 01/29/2021  [facility] and [place] Number:  Engineering geologist and Address:  [facility], [address] [place], [place] [address]      Provider Number: [id number]  Attending Physician Name and Address:  Wyvonnia Dusky, MD  Relative Name and Phone Number:  Versie Fleener (son) [phone]    Current Level of Care: Hospital Recommended Level of Care: [place] Prior Approval Number:    Date Approved/Denied:   PASRR Number: pending  Discharge Plan: SNF    Current Diagnoses: Patient Active Problem List   Diagnosis Date Noted   Protein-calorie malnutrition, severe 01/31/2021   Intracranial hemorrhage ([place]) 60/73/7106   Acute metabolic encephalopathy 26/94/8546   Acute kidney injury superimposed on CKD ([place]) 01/29/2021   Intracranial hematoma ([place]) 01/29/2021   Prostate cancer metastatic to multiple sites ([place]) 01/17/2021   Anxiety 12/26/2020   GERD (gastroesophageal reflux disease) 12/26/2020   Hypercholesterolemia 12/26/2020   Depression 10/19/2020   Mild cognitive impairment 02/04/2018   Seizure-like activity ([place]) 02/04/2018   History of stroke 11/26/2017   Chronic pruritus 03/31/2017   Eosinophil count raised 03/31/2017   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   Chronic kidney disease, stage III (moderate) ([place]) 03/16/2014    Orientation RESPIRATION BLADDER Height & Weight     Self  Normal Continent Weight: 52.4 kg Height:  5' 7.01" (170.2 cm)  BEHAVIORAL SYMPTOMS/MOOD NEUROLOGICAL BOWEL NUTRITION STATUS    Convulsions/Seizures (possible seizure activity not diagnosed with Seizure disorder) Continent Diet  AMBULATORY STATUS COMMUNICATION OF NEEDS Skin   Limited Assist Verbally Normal                       Personal Care Assistance Level of  Assistance  Bathing, Feeding, Dressing Bathing Assistance: Limited assistance Feeding assistance: Limited assistance Dressing Assistance: Limited assistance     Functional Limitations Info  Sight, Hearing, Speech Sight Info: Adequate Hearing Info: Adequate Speech Info: Adequate    SPECIAL CARE FACTORS FREQUENCY  PT (By licensed PT), OT (By licensed OT)     PT Frequency: 5 times per week OT Frequency: 5 times per week            Contractures Contractures Info: Not present    Additional Factors Info  Code Status, Allergies Code Status Info: Full Allergies Info: Amoxicillin, lansoprazole, sulfa           Current Medications (01/31/2021):  This is the current hospital active medication list Current Facility-Administered Medications  Medication Dose Route Frequency Provider Last Rate Last Admin   acetaminophen (TYLENOL) tablet 650 mg  650 mg Oral Q6H PRN Lenore Cordia, MD   650 mg at 01/30/21 1730   Or   acetaminophen (TYLENOL) suppository 650 mg  650 mg Rectal Q6H PRN Lenore Cordia, MD       feeding supplement (ENSURE ENLIVE / ENSURE PLUS) liquid 237 mL  237 mL Oral TID BM Wyvonnia Dusky, MD   237 mL at 01/31/21 0928   finasteride (PROSCAR) tablet 5 mg  5 mg Oral Daily Wyvonnia Dusky, MD   5 mg at 01/31/21 2703   haloperidol lactate (HALDOL) injection 1 mg  1 mg Intramuscular Q6H PRN Lenore Cordia, MD   1 mg at 01/30/21 2328   loperamide (IMODIUM) capsule 2 mg  2 mg Oral  PRN Lenore Cordia, MD       multivitamin with minerals tablet 1 tablet  1 tablet Oral Daily Wyvonnia Dusky, MD   1 tablet at 01/31/21 0928   ondansetron (ZOFRAN) tablet 4 mg  4 mg Oral Q6H PRN Lenore Cordia, MD       Or   ondansetron [facility]) injection 4 mg  4 mg Intravenous Q6H PRN Lenore Cordia, MD       predniSONE (DELTASONE) tablet 20 mg  20 mg Oral Q breakfast Zada Finders R, MD   20 mg at 01/31/21 0838   sertraline (ZOLOFT) tablet 50 mg  50 mg Oral Daily Lenore Cordia,  MD   50 mg at 01/31/21 9678     Discharge Medications: Please see discharge summary for a list of discharge medications.  Relevant Imaging Results:  Relevant Lab Results:   Additional Information SS# [id number]  Shelbie Hutching, RN

## 2021-01-31 NOTE — Progress Notes (Signed)
To whom it may concern:  Please be advised that the above named patient will require a short term nursing home stay- anticipated 30 days or less for rehabilitation and strengthening.  The plan is for return home.

## 2021-01-31 NOTE — TOC Progression Note (Signed)
Transition of Care [facility] - Progression Note    Patient Details  Name: KEDRON UNO MRN: [id number] Date of Birth: [birth year]  [facility] CM/SW Contact  Shelbie Hutching, RN Phone Number: 01/31/2021, 10:40 AM  Clinical Narrative:    PT is recommending SNF and family is in agreement with this.  SNF workup started, Pasrr pending, bed search started.    Expected Discharge Plan: [place] Barriers to Discharge: Continued Medical Work up  Expected Discharge Plan and Services Expected Discharge Plan: [place]   Discharge Planning Services: CM Consult Post Acute Care Choice: [place] Living arrangements for the past 2 months: Single Family Home Expected Discharge Date: 02/04/21               DME Arranged: N/A DME Agency: NA       HH Arranged: NA           Social Determinants of Health (SDOH) Interventions    Readmission Risk Interventions No flowsheet data found.

## 2021-02-01 DIAGNOSIS — N189 Chronic kidney disease, unspecified: Secondary | ICD-10-CM

## 2021-02-01 DIAGNOSIS — N179 Acute kidney failure, unspecified: Secondary | ICD-10-CM

## 2021-02-01 LAB — BASIC METABOLIC PANEL
Anion gap: 2 — ABNORMAL LOW (ref 5–15)
BUN: 60 mg/dL — ABNORMAL HIGH (ref 8–23)
CO2: 20 mmol/L — ABNORMAL LOW (ref 22–32)
Calcium: 8.2 mg/dL — ABNORMAL LOW (ref 8.9–10.3)
Chloride: 119 mmol/L — ABNORMAL HIGH (ref 98–111)
Creatinine, Ser: 1.47 mg/dL — ABNORMAL HIGH (ref 0.61–1.24)
GFR, Estimated: 46 mL/min — ABNORMAL LOW (ref >60)
Glucose, Bld: 124 mg/dL — ABNORMAL HIGH (ref 70–99)
Potassium: 5.4 mmol/L — ABNORMAL HIGH (ref 3.5–5.1)
Sodium: 141 mmol/L (ref 135–145)

## 2021-02-01 LAB — CBC
HCT: 23.3 % — ABNORMAL LOW (ref 39.0–52.0)
Hemoglobin: 7.6 g/dL — ABNORMAL LOW (ref 13.0–17.0)
MCH: 31.9 pg (ref 26.0–34.0)
MCHC: 32.6 g/dL (ref 30.0–36.0)
MCV: 97.9 fL (ref 80.0–100.0)
Platelets: 119 10*3/uL — ABNORMAL LOW (ref 150–400)
RBC: 2.38 MIL/uL — ABNORMAL LOW (ref 4.22–5.81)
RDW: 19 % — ABNORMAL HIGH (ref 11.5–15.5)
WBC: 9.5 10*3/uL (ref 4.0–10.5)
nRBC: 3.6 % — ABNORMAL HIGH (ref 0.0–0.2)

## 2021-02-01 LAB — POTASSIUM: Potassium: 5.4 mmol/L — ABNORMAL HIGH (ref 3.5–5.1)

## 2021-02-01 MED ORDER — ACETAMINOPHEN-CODEINE 120-12 MG/5ML PO SOLN
5.0000 mL | Freq: Three times a day (TID) | ORAL | Status: DC | PRN
  Administered 2021-02-01 – 2021-02-06 (×5): 5 mL via ORAL
  Filled 2021-02-01 (×5): qty 5

## 2021-02-01 MED ORDER — MEGESTROL ACETATE 20 MG PO TABS
40.0000 mg | ORAL_TABLET | Freq: Every day | ORAL | Status: DC
  Administered 2021-02-01 – 2021-02-06 (×5): 40 mg via ORAL
  Filled 2021-02-01 (×6): qty 2

## 2021-02-01 MED ORDER — SODIUM ZIRCONIUM CYCLOSILICATE 10 G PO PACK
10.0000 g | PACK | Freq: Once | ORAL | Status: AC
  Administered 2021-02-01: 08:00:00 10 g via ORAL
  Filled 2021-02-01: qty 1

## 2021-02-01 NOTE — Plan of Care (Signed)
Pt Aox2, VSS, remains on room air. No endorsement of pain overnight. Able to sleep intermittently, otherwise was sitting up in chair or ambulating in hall and to bathroom. Encouraged PO intake overnight, able to drink an ensure, a chocolate milk, and eat a little cake brought by family. Fall/safety precautions in place, rounding performed, needs/concerns addressed.   Problem: Education: Goal: Knowledge of General Education information will improve Description: Including pain rating scale, medication(s)/side effects and non-pharmacologic comfort measures Outcome: Progressing   Problem: Health Behavior/Discharge Planning: Goal: Ability to manage health-related needs will improve Outcome: Progressing   Problem: Clinical Measurements: Goal: Ability to maintain clinical measurements within normal limits will improve Outcome: Progressing Goal: Will remain free from infection Outcome: Progressing Goal: Diagnostic test results will improve Outcome: Progressing Goal: Respiratory complications will improve Outcome: Progressing Goal: Cardiovascular complication will be avoided Outcome: Progressing   Problem: Activity: Goal: Risk for activity intolerance will decrease Outcome: Progressing   Problem: Nutrition: Goal: Adequate nutrition will be maintained Outcome: Progressing   Problem: Coping: Goal: Level of anxiety will decrease Outcome: Progressing   Problem: Elimination: Goal: Will not experience complications related to bowel motility Outcome: Progressing Goal: Will not experience complications related to urinary retention Outcome: Progressing   Problem: Pain Managment: Goal: General experience of comfort will improve Outcome: Progressing   Problem: Safety: Goal: Ability to remain free from injury will improve Outcome: Progressing   Problem: Skin Integrity: Goal: Risk for impaired skin integrity will decrease Outcome: Progressing

## 2021-02-01 NOTE — Progress Notes (Signed)
PT Cancellation Note  Patient Details Name: DOREAN DANIELLO MRN: [id number] DOB: 12-27-1929   Cancelled Treatment:    Reason Eval/Treat Not Completed: Medical issues which prohibited therapy.  Pt's potassium noted to be elevated to 5.4 this morning.  Per PT guidelines for elevated potassium, exertional activity contraindicated.  Will hold PT at this time and re-attempt PT session at a later date/time as medically appropriate.  Leitha Bleak, PT 02/01/21, 10:44 AM

## 2021-02-01 NOTE — Progress Notes (Signed)
OT Cancellation Note  Patient Details Name: Taylor Hogan MRN: [id number] DOB: 1929-11-19   Cancelled Treatment:    Reason Eval/Treat Not Completed: Medical issues which prohibited therapy. Pt with elevated K+ of 5.4 which is contraindicated for therapeutic intervention. OT to hold today and follow up when pt is able to actively participate in OT intervention.   Darleen Crocker, MS, OTR/L , CBIS ascom [phone]  02/01/21, 2:06 PM

## 2021-02-01 NOTE — Care Management Important Message (Signed)
Important Message  Patient Details  Name: Taylor Hogan MRN: [id number] Date of Birth: [birth year]   Medicare Important Message Given:  N/A - LOS <3 / Initial given by admissions     Taylor Hogan 02/01/2021, 8:45 AM

## 2021-02-01 NOTE — Progress Notes (Signed)
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 D/c IVFs. Oriented to person only today   Acute on chronic subdural hematoma: etiology unclear, possibly a fall at home. No surgical intervention or f/u needed as per neuro surg. Neuro surg recs apprec  AKI on CKDIIIb: Cr is labile. Will continue to monitor   Hyperkalemia: lokelma x 1. Repeat K level ordered   Thrombocytopenia: likely secondary to recent chemo. Will continue to monitor   Macrocytic anemia: likely secondary to recent chemo. H&H are labile   Metastatic prostate cancer: w/ mets to bone. Chemo as per onco  HTN: continue to hold home dose of lisinopril as BP is low end of normal   Depression: severity unknown. Continue on home dose of sertraline   Mild cognitive impairment: continue w/ supportive care   DVT prophylaxis: SCDs Code Status: full Family Communication: called pt's son, Gerald Stabs, but no answer & I left a voicemail  Disposition Plan: likely d/c to SNF   Level of care: Med-Surg  Status is: Inpatient  Remains inpatient appropriate because:Inpatient level of care appropriate due to severity of illness  Dispo: The patient is from: Home              Anticipated d/c is to: SNF              Patient currently: is medically stable for d/c   Difficult to place patient : unclear   Consultants:  Neuro surg   Procedures:   Antimicrobials:    Subjective: Pt c/o weakness  Objective: Vitals:   01/31/21 1157 01/31/21 1606  01/31/21 2101 02/01/21 0556  BP: (!) 118/58 (!) 112/59 119/65 111/65  Pulse: 97 76 75 99  Resp: 17 18 18 18   Temp: 98.1 F (36.7 C) 97.8 F (36.6 C) 97.8 F (36.6 C) 97.8 F (36.6 C)  TempSrc:    Oral  SpO2: 100% 100% 100% 100%  Weight:      Height:        Intake/Output Summary (Last 24 hours) at 02/01/2021 0719 Last data filed at 02/01/2021 0209 Gross per 24 hour  Intake 955 ml  Output --  Net 955 ml   Filed Weights   01/29/21 1356 01/29/21 2313  Weight: 56 kg 52.4 kg    Examination:  General exam: Appears comfortable  Respiratory system: clear breath sounds b/l  Cardiovascular system: S1 & S2+. No rubs or gallops Gastrointestinal system: Abd is soft, NT, ND & hypoactive bowel sounds  Central nervous system: Awake and alert to person only. Moves all extremities  Psychiatry: judgement and insight appear abnormal. Flat mood and affect    Data Reviewed: I have personally reviewed following labs and imaging studies  CBC: Recent Labs  Lab 01/29/21 1406 01/30/21 0359 01/31/21 0926 01/31/21 1349 02/01/21 0205  WBC 10.9* 7.7 11.2*  --  9.5  HGB 8.7* 7.6* 8.2* 8.0* 7.6*  HCT 23.4* 21.3* 25.6* 24.1* 23.3*  MCV 97.5 99.5 93.8  --  97.9  PLT 145* 106* 110*  --  914*   Basic Metabolic  Panel: Recent Labs  Lab 01/29/21 1406 01/30/21 0359 01/31/21 0926 02/01/21 0205  NA 143 145 144 141  K 4.8 4.6 4.7 5.4*  CL 115* 118* 119* 119*  CO2 19* 19* 18* 20*  GLUCOSE 109* 92 102* 124*  BUN 119* 103* 71* 60*  CREATININE 2.07* 1.70* 1.45* 1.47*  CALCIUM 8.1* 8.1* 8.1* 8.2*   GFR: Estimated Creatinine Clearance: 25.7 mL/min (A) (by C-G formula based on SCr of 1.47 mg/dL (H)). Liver Function Tests: Recent Labs  Lab 01/29/21 1516 01/30/21 0359  AST 25 25  ALT 27 21  ALKPHOS 446* 372*  BILITOT 0.8 0.8  PROT 7.2 5.6*  ALBUMIN 3.4* 2.7*   No results for input(s): LIPASE, AMYLASE in the last 168 hours. No results for input(s): AMMONIA in the last 168  hours. Coagulation Profile: No results for input(s): INR, PROTIME in the last 168 hours. Cardiac Enzymes: Recent Labs  Lab 01/29/21 1516  CKTOTAL 26*   BNP (last 3 results) No results for input(s): PROBNP in the last 8760 hours. HbA1C: No results for input(s): HGBA1C in the last 72 hours. CBG: No results for input(s): GLUCAP in the last 168 hours. Lipid Profile: No results for input(s): CHOL, HDL, LDLCALC, TRIG, CHOLHDL, LDLDIRECT in the last 72 hours. Thyroid Function Tests: No results for input(s): TSH, T4TOTAL, FREET4, T3FREE, THYROIDAB in the last 72 hours. Anemia Panel: No results for input(s): VITAMINB12, FOLATE, FERRITIN, TIBC, IRON, RETICCTPCT in the last 72 hours. Sepsis Labs: Recent Labs  Lab 01/29/21 1516  LATICACIDVEN 2.0*    Recent Results (from the past 240 hour(s))  Resp Panel by RT-PCR (Flu A&B, Covid) Nasopharyngeal Swab     Status: None   Collection Time: 01/29/21  3:16 PM   Specimen: Nasopharyngeal Swab; Nasopharyngeal(NP) swabs in vial transport medium  Result Value Ref Range Status   SARS Coronavirus 2 by RT PCR NEGATIVE NEGATIVE Final    Comment: (NOTE) SARS-CoV-2 target nucleic acids are NOT DETECTED.  The SARS-CoV-2 RNA is generally detectable in upper respiratory specimens during the acute phase of infection. The lowest concentration of SARS-CoV-2 viral copies this assay can detect is 138 copies/mL. A negative result does not preclude SARS-Cov-2 infection and should not be used as the sole basis for treatment or other patient management decisions. A negative result may occur with  improper specimen collection/handling, submission of specimen other than nasopharyngeal swab, presence of viral mutation(s) within the areas targeted by this assay, and inadequate number of viral copies(<138 copies/mL). A negative result must be combined with clinical observations, patient history, and epidemiological information. The expected result is  Negative.  Fact Sheet for Patients:  EntrepreneurPulse.com.au  Fact Sheet for Healthcare Providers:  IncredibleEmployment.be  This test is no t yet approved or cleared by the [place] FDA and  has been authorized for detection and/or diagnosis of SARS-CoV-2 by FDA under an Emergency Use Authorization (EUA). This EUA will remain  in effect (meaning this test can be used) for the duration of the COVID-19 declaration under Section 564(b)(1) of the Act, 21 U.S.C.section 360bbb-3(b)(1), unless the authorization is terminated  or revoked sooner.       Influenza A by PCR NEGATIVE NEGATIVE Final   Influenza B by PCR NEGATIVE NEGATIVE Final    Comment: (NOTE) The Xpert Xpress SARS-CoV-2/FLU/RSV plus assay is intended as an aid in the diagnosis of influenza from Nasopharyngeal swab specimens and should not be used as a sole basis for treatment. Nasal washings and aspirates are unacceptable for Xpert  Xpress SARS-CoV-2/FLU/RSV testing.  Fact Sheet for Patients: EntrepreneurPulse.com.au  Fact Sheet for Healthcare Providers: IncredibleEmployment.be  This test is not yet approved or cleared by the [place] FDA and has been authorized for detection and/or diagnosis of SARS-CoV-2 by FDA under an Emergency Use Authorization (EUA). This EUA will remain in effect (meaning this test can be used) for the duration of the COVID-19 declaration under Section 564(b)(1) of the Act, 21 U.S.C. section 360bbb-3(b)(1), unless the authorization is terminated or revoked.  Performed at [facility], [address] [place], [place] [address]          Radiology Studies: No results found.      Scheduled Meds:  feeding supplement  237 mL Oral TID BM   finasteride  5 mg Oral Daily   multivitamin with minerals  1 tablet Oral Daily   predniSONE  20 mg Oral Q breakfast   sertraline  50 mg Oral Daily    sodium zirconium cyclosilicate  10 g Oral Once   Continuous Infusions:     LOS: 2 days    Time spent: 30 mins     Wyvonnia Dusky, MD [facility] Pager [phone]  If 7PM-7AM, please contact night-coverage 02/01/2021, 7:19 AM

## 2021-02-02 DIAGNOSIS — G9341 Metabolic encephalopathy: Secondary | ICD-10-CM | POA: Diagnosis not present

## 2021-02-02 DIAGNOSIS — D539 Nutritional anemia, unspecified: Secondary | ICD-10-CM

## 2021-02-02 LAB — BASIC METABOLIC PANEL
Anion gap: 5 (ref 5–15)
BUN: 51 mg/dL — ABNORMAL HIGH (ref 8–23)
CO2: 20 mmol/L — ABNORMAL LOW (ref 22–32)
Calcium: 8.1 mg/dL — ABNORMAL LOW (ref 8.9–10.3)
Chloride: 118 mmol/L — ABNORMAL HIGH (ref 98–111)
Creatinine, Ser: 1.39 mg/dL — ABNORMAL HIGH (ref 0.61–1.24)
GFR, Estimated: 49 mL/min — ABNORMAL LOW (ref >60)
Glucose, Bld: 112 mg/dL — ABNORMAL HIGH (ref 70–99)
Potassium: 5.1 mmol/L (ref 3.5–5.1)
Sodium: 143 mmol/L (ref 135–145)

## 2021-02-02 LAB — PREPARE RBC (CROSSMATCH)

## 2021-02-02 MED ORDER — SODIUM CHLORIDE 0.9% IV SOLUTION
Freq: Once | INTRAVENOUS | Status: AC
  Administered 2021-02-02: 13:00:00 1 mL via INTRAVENOUS

## 2021-02-02 NOTE — Progress Notes (Signed)
Physical Therapy Treatment Patient Details Name: Taylor Hogan MRN: [id number] DOB: [birth year] Today's Date: 02/02/2021    History of Present Illness Pt is an 87 y.o. male admitted to hospital 7/12 with decreased p.o. intake, weakness, and increased agitation at home.  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  PMH includes anxiety, skin CA, prostate CA on chemotherapy, metastatic disease to bone, CKD, seizure like activity, and h/o stroke.    PT Comments    Pt resting in bed upon PT arrival; agreeable to PT session.  Tolerated LE ex's in bed fairly well.  SBA with bed mobility; CGA with transfers (pt unsteady with standing requiring CGA for safety); and CGA to min assist with ambulation 20 feet and then (after sitting rest break) 50 feet with RW.  Limited distance ambulating d/t SOB and reports of LE weakness (O2 sats on room air and HR WFL during sessions activities).  Pt reporting being tired after walking so pt assisted back to bed with all needs in reach.  Will continue to focus on strengthening, balance, and progressive functional mobility during hospitalization.    Follow Up Recommendations  SNF     Equipment Recommendations  Rolling walker with 5" wheels;3in1 (PT)    Recommendations for Other Services OT consult     Precautions / Restrictions Precautions Precautions: Fall Precaution Comments: Metastatic disease to bones; small R SDH Restrictions Weight Bearing Restrictions: No    Mobility  Bed Mobility Overal bed mobility: Needs Assistance Bed Mobility: Supine to Sit;Sit to Supine     Supine to sit: Supervision;HOB elevated Sit to supine: Supervision;HOB elevated   General bed mobility comments: SBA for safety     Transfers Overall transfer level: Needs assistance Equipment used: Rolling walker (2 wheeled) Transfers: Sit to/from Stand Sit to Stand: Min guard         General transfer comment: pt mildly unsteady with standing requiring CGA for safety; use of RW; vc's for UE placement with transfers  Ambulation/Gait Ambulation/Gait assistance: Min guard;Min assist Gait Distance (Feet):  (20 feet; 50 feet) Assistive device: Rolling walker (2 wheeled)   Gait velocity: decreased   General Gait Details: decreased B LE step length; narrow BOS with turning (pt unsteady requiring CGA to min assist for balance)   Stairs             Wheelchair Mobility    Modified Rankin (Stroke Patients Only)       Balance Overall balance assessment: Needs assistance Sitting-balance support: No upper extremity supported;Feet supported Sitting balance-Leahy Scale: Good Sitting balance - Comments: steady sitting reaching within BOS   Standing balance support: Bilateral upper extremity supported;During functional activity Standing balance-Leahy Scale: Poor Standing balance comment: CGA to min assist with turns (during walking using RW)                            Cognition Arousal/Alertness: Awake/alert Behavior During Therapy: Impulsive Overall Cognitive Status: No family/caregiver present to determine baseline cognitive functioning Area of Impairment: Orientation;Attention;Following commands;Safety/judgement;Awareness;Problem solving;Memory                 Orientation Level: Disoriented to;Time;Situation Current Attention Level: Focused   Following Commands: Follows multi-step commands inconsistently;Follows multi-step commands with increased time Safety/Judgement: Decreased awareness of safety;Decreased awareness of deficits Awareness: Intellectual  Problem Solving: Slow processing;Difficulty sequencing;Requires verbal cues;Requires tactile cues        Exercises General  Exercises - Lower Extremity Ankle Circles/Pumps: AROM;Strengthening;Both;10 reps;Supine Quad Sets: AROM;Strengthening;Both;10 reps;Supine Short Arc Quad: AROM;Strengthening;Both;10 reps;Supine Heel Slides: AROM;Strengthening;Both;10 reps;Supine Hip ABduction/ADduction: AROM;Strengthening;Both;10 reps;Supine    General Comments  Nursing cleared pt for participation in physical therapy.  Pt agreeable to PT session.      Pertinent Vitals/Pain Pain Assessment: No/denies pain Faces Pain Scale: No hurt Pain Intervention(s): Limited activity within patient's tolerance;Monitored during session;Repositioned Vitals (HR and O2 on room air) stable and WFL throughout treatment session.    Home Living                      Prior Function            PT Goals (current goals can now be found in the care plan section) Acute Rehab PT Goals Patient Stated Goal: to improve memory and mobility PT Goal Formulation: With patient Time For Goal Achievement: 02/13/21 Potential to Achieve Goals: Good Progress towards PT goals: Progressing toward goals    Frequency    7X/week      PT Plan Current plan remains appropriate    Co-evaluation              AM-PAC PT "6 Clicks" Mobility   Outcome Measure  Help needed turning from your back to your side while in a flat bed without using bedrails?: None Help needed moving from lying on your back to sitting on the side of a flat bed without using bedrails?: None Help needed moving to and from a bed to a chair (including a wheelchair)?: A Little Help needed standing up from a chair using your arms (e.g., wheelchair or bedside chair)?: A Little Help needed to walk in hospital room?: A Little Help needed climbing 3-5 steps with a railing? : A Lot 6 Click Score: 19    End of Session Equipment Utilized During Treatment: Gait belt Activity Tolerance: Patient limited by fatigue Patient left: in bed;with call bell/phone within reach;with bed  alarm set;Other (comment) (fall mat next to bed) Nurse Communication: Mobility status;Precautions PT Visit Diagnosis: Unsteadiness on feet (R26.81);Other abnormalities of gait and mobility (R26.89);Muscle weakness (generalized) (M62.81)     Time: 2119-4174 PT Time Calculation (min) (ACUTE ONLY): 23 min  Charges:  $Gait Training: 8-22 mins $Therapeutic Exercise: 8-22 mins                    Leitha Bleak, PT 02/02/21, 9:41 AM

## 2021-02-02 NOTE — Plan of Care (Signed)
Pt oriented to self and occasionally place. VSS. C/o generalized pain addressed with PRN tylenol/codeine, able to rest comfortably afterwards. Bladder scans <12ml post void. Ambulatory to bathroom with walker and x1 assist, frequent urination, no BM this shift. Fall/safety precautions in place, rounding performed, needs/concerns addressed. Son requesting to see if Infectious Disease possible to consult for rash bothering patient's back, will pass to day RN as well.   Problem: Education: Goal: Knowledge of General Education information will improve Description: Including pain rating scale, medication(s)/side effects and non-pharmacologic comfort measures Outcome: Progressing   Problem: Health Behavior/Discharge Planning: Goal: Ability to manage health-related needs will improve Outcome: Progressing   Problem: Clinical Measurements: Goal: Ability to maintain clinical measurements within normal limits will improve Outcome: Progressing Goal: Will remain free from infection Outcome: Progressing Goal: Diagnostic test results will improve Outcome: Progressing Goal: Respiratory complications will improve Outcome: Progressing Goal: Cardiovascular complication will be avoided Outcome: Progressing   Problem: Nutrition: Goal: Adequate nutrition will be maintained Outcome: Progressing   Problem: Coping: Goal: Level of anxiety will decrease Outcome: Progressing   Problem: Elimination: Goal: Will not experience complications related to bowel motility Outcome: Progressing Goal: Will not experience complications related to urinary retention Outcome: Progressing   Problem: Pain Managment: Goal: General experience of comfort will improve Outcome: Progressing

## 2021-02-02 NOTE — Consult Note (Addendum)
[facility] CONSULT NOTE  Patient Care Team: System, Provider Not In as PCP - General  CHIEF COMPLAINTS/PURPOSE OF CONSULTATION:  Metastatic prostate cancer  HISTORY OF PRESENTING ILLNESS:  Taylor Hogan 87 y.o.  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  As part of the work-up patient noted to have a small subdural hematoma.  Patient received first dose of Firmagon/antitestosterone agent approximately 2 weeks days ago.  He also started on prednisone for appetite/weight loss.  However on the day prior to admission noted to have acute delirious episode-unable to follow commands.  Concern for fall.   Otherwise work-up in the hospital including chest x-ray/abdominal CT scan did not show an acute process.  Labs show chronic anemia stable white count and stable platelets are 100.    Review of Systems  Unable to perform ROS: Mental status change    MEDICAL HISTORY:  Past Medical History:  Diagnosis Date   Anxiety    Cancer ([place])    skin   Cataract    Depression     SURGICAL HISTORY: Past Surgical History:  Procedure Laterality Date   CATARACT EXTRACTION  2012   polyp removal     TONSILLECTOMY      SOCIAL HISTORY: Social History   Socioeconomic History   Marital status: Married    Spouse name: Not on file   Number of children: Not on file   Years of education: Not on file   Highest education level: Not on file  Occupational History   Not on file  Tobacco Use   Smoking status: Former   Smokeless tobacco: Former    Types: Snuff  Substance and Sexual Activity   Alcohol use: No   Drug use: No   Sexual activity: Not Currently  Other Topics Concern   Not on file  Social History Narrative   Not on file   Social Determinants of Health   Financial Resource Strain: Not on file   Food Insecurity: Not on file  Transportation Needs: Not on file  Physical Activity: Not on file  Stress: Not on file  Social Connections: Not on file  Intimate Partner Violence: Not on file    FAMILY HISTORY: Family History  Problem Relation Age of Onset   Emphysema Mother     ALLERGIES:  is allergic to amoxicillin, lansoprazole, and sulfa antibiotics.  MEDICATIONS:  Current Facility-Administered Medications  Medication Dose Route Frequency Provider Last Rate Last Admin   acetaminophen (TYLENOL) tablet 650 mg  650 mg Oral Q6H PRN Lenore Cordia, MD   650 mg at 01/31/21 1549   Or   acetaminophen (TYLENOL) suppository 650 mg  650 mg Rectal Q6H PRN Lenore Cordia, MD       acetaminophen-codeine 120-12 MG/5ML solution 5 mL  5 mL Oral Q8H PRN Wyvonnia Dusky, MD   5 mL at 02/01/21 2048   feeding supplement (ENSURE ENLIVE / ENSURE PLUS) liquid 237 mL  237 mL Oral TID BM Wyvonnia Dusky, MD   237 mL at 02/02/21 0815   finasteride (PROSCAR) tablet 5 mg  5 mg Oral Daily Wyvonnia Dusky, MD   5 mg at 02/02/21 0815   haloperidol lactate (HALDOL) injection 1 mg  1 mg Intramuscular Q6H PRN Lenore Cordia, MD   1 mg at 01/30/21 2328   loperamide (IMODIUM)  capsule 2 mg  2 mg Oral PRN Zada Finders R, MD   2 mg at 01/31/21 1549   megestrol (MEGACE) tablet 40 mg  40 mg Oral Daily Wyvonnia Dusky, MD   40 mg at 02/02/21 5631   multivitamin with minerals tablet 1 tablet  1 tablet Oral Daily Wyvonnia Dusky, MD   1 tablet at 02/02/21 0815   ondansetron (ZOFRAN) tablet 4 mg  4 mg Oral Q6H PRN Lenore Cordia, MD       Or   ondansetron (ZOFRAN) injection 4 mg  4 mg Intravenous Q6H PRN Lenore Cordia, MD          .  PHYSICAL EXAMINATION:  Vitals:   02/02/21 1552 02/02/21 1652  BP: (!) 117/53 (!) 115/55  Pulse: 71 72  Resp: 18 18  Temp: 98.1 F (36.7 C) 98.3 F (36.8 C)  SpO2: 100% 100%   Filed Weights   01/29/21 1356 01/29/21 2313  Weight: 123 lb 7.3 oz (56 kg)  115 lb 8.3 oz (52.4 kg)    Physical Exam Vitals and nursing note reviewed.  Constitutional:      Comments: Patient resting in the bed.   Alone; Patient sitting.   HENT:     Head: Normocephalic and atraumatic.     Mouth/Throat:     Mouth: Mucous membranes are moist.     Pharynx: No oropharyngeal exudate.  Eyes:     Pupils: Pupils are equal, round, and reactive to light.  Cardiovascular:     Rate and Rhythm: Normal rate and regular rhythm.  Pulmonary:     Effort: No respiratory distress.     Breath sounds: No wheezing.     Comments: Decreased breath sounds bilaterally at bases.  No wheeze or crackles Abdominal:     General: Bowel sounds are normal. There is no distension.     Palpations: Abdomen is soft. There is no mass.     Tenderness: There is no abdominal tenderness. There is no guarding or rebound.  Musculoskeletal:        General: No tenderness. Normal range of motion.     Cervical back: Normal range of motion and neck supple.  Skin:    General: Skin is warm.  Neurological:     Mental Status: He is alert. He is disoriented.  Psychiatric:        Mood and Affect: Affect normal.        Judgment: Judgment normal.     LABORATORY DATA:  I have reviewed the data as listed Lab Results  Component Value Date   WBC 7.4 02/02/2021   HGB 7.1 (L) 02/02/2021   HCT 16.5 (L) 02/02/2021   MCV 99.4 02/02/2021   PLT 109 (L) 02/02/2021   Recent Labs    01/02/21 0955 01/17/21 0935 01/29/21 1516 01/30/21 0359 01/31/21 0926 02/01/21 0205 02/01/21 1806 02/02/21 0430  NA 142   < >  --  145 144 141  --  143  K 4.5   < >  --  4.6 4.7 5.4* 5.4* 5.1  CL 109   < >  --  118* 119* 119*  --  118*  CO2 24   < >  --  19* 18* 20*  --  20*  GLUCOSE 105*   < >  --  92 102* 124*  --  112*  BUN 24*   < >  --  103* 71* 60*  --  51*  CREATININE 1.44*   < >  --  1.70* 1.45* 1.47*  --  1.39*  CALCIUM 8.6*   < >  --  8.1* 8.1* 8.2*  --  8.1*  GFRNONAA 47*   < >  --  38* 46* 46*  --  49*  PROT  7.2  --  7.2 5.6*  --   --   --   --   ALBUMIN 3.5  --  3.4* 2.7*  --   --   --   --   AST 42*  --  25 25  --   --   --   --   ALT 16  --  27 21  --   --   --   --   ALKPHOS 767*  --  446* 372*  --   --   --   --   BILITOT 0.6  --  0.8 0.8  --   --   --   --   BILIDIR  --   --  <0.1  --   --   --   --   --   IBILI  --   --  NOT CALCULATED  --   --   --   --   --    < > = values in this interval not displayed.    RADIOGRAPHIC STUDIES: I have personally reviewed the radiological images as listed and agreed with the findings in the report. CT ABDOMEN PELVIS WO CONTRAST  Result Date: 01/29/2021 CLINICAL DATA:  Weakness, decreased p.o. intake. No bowel movement for 1 week. History of prostate cancer EXAM: CT ABDOMEN AND PELVIS WITHOUT CONTRAST TECHNIQUE: Multidetector CT imaging of the abdomen and pelvis was performed following the standard protocol without IV contrast. COMPARISON:  01/03/2021 FINDINGS: Lower chest: Included lung bases are clear.  Heart size is normal. Hepatobiliary: Stable size and appearance of multiple low-density lesions within the liver, largest of which is in the anterior left hepatic lobe measuring approximately 4.0 cm with internal fluid density compatible with a simple cyst. Unremarkable gallbladder. No hyperdense gallstone. No biliary dilatation. Pancreas: Unremarkable. No pancreatic ductal dilatation or surrounding inflammatory changes. Spleen: Normal in size without focal abnormality. Adrenals/Urinary Tract: Unremarkable adrenal glands. Tiny vascular calcifications versus nonobstructing calculi within each kidney are unchanged. No hydronephrosis. Urinary bladder appears within normal limits. Stomach/Bowel: Small hiatal hernia. Stomach otherwise within normal limits. No dilated loops bowel to suggest obstruction. Sigmoid diverticulosis. No focal bowel wall thickening or inflammatory changes. Vascular/Lymphatic: Aortoiliac atherosclerosis without aneurysm. Circumaortic left renal  vein, an anatomic variant. No abdominopelvic lymphadenopathy. Reproductive: Mildly enlarged prostate gland. Other: No free fluid. No abdominopelvic fluid collection. No pneumoperitoneum. No abdominal wall hernia. Musculoskeletal: Markedly abnormal appearance of the visualized osseous structures with extensive sclerosis most suggestive of diffuse osseous metastatic disease in the setting of known prostate cancer. No pathologic fracture identified. Degenerative disc disease of the lumbar spine. IMPRESSION: 1. No acute abdominopelvic findings. No evidence of bowel obstruction. 2. Sigmoid diverticulosis without evidence of acute diverticulitis. 3. Redemonstrated markedly abnormal appearance of the visualized osseous structures with extensive sclerosis most suggestive of diffuse osseous metastatic disease in the setting of known prostate cancer. No pathologic fracture identified. Aortic Atherosclerosis (ICD10-I70.0). Electronically Signed   By: Davina Poke D.O.   On: 01/29/2021 16:07   CT Head Wo Contrast  Result Date: 01/29/2021 CLINICAL DATA:  Weakness EXAM: CT HEAD WITHOUT CONTRAST TECHNIQUE: Contiguous axial images were obtained from the base of the skull through the vertex without intravenous contrast. COMPARISON:  Head  CT 01/29/2021 FINDINGS: Brain: Slightly decreased thickness right convexity subdural hematoma, subacute on chronic. No midline shift or other mass effect. Diffuse white matter hypoattenuation and old right posterior MCA territory infarct. Generalized volume loss. Vascular: No hyperdense vessel or unexpected calcification. Skull: Normal. Negative for fracture or focal lesion. Sinuses/Orbits: No acute finding. Other: None IMPRESSION: 1. Slightly decreased thickness of right convexity subdural hematoma, subacute on chronic. No midline shift or other mass effect. 2. 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 Electronically Signed   By: Ulyses Jarred  M.D.   On: 01/29/2021 22:44   CT Head Wo Contrast  Result Date: 01/29/2021 CLINICAL DATA:  Altered mental status, weakness EXAM: CT HEAD WITHOUT CONTRAST TECHNIQUE: Contiguous axial images were obtained from the base of the skull through the vertex without intravenous contrast. COMPARISON:  MRI brain February 23, 2018 and head CT February 18, 2010 FINDINGS: Brain: Mixed density right whole hemispheric collection measuring up to 7 mm on coronal image 24. There is a hyperdense focus along the inferior aspect of the collection also seen on image 24 of the coronal sequence. Mild effacement of the parenchyma adjacent to the collection without other findings of mass effect. Age related global parenchymal volume loss with ex vacuo dilatation of the ventricular system. Similar burden of moderate to severe chronic ischemic small vessel white matter disease. Encephalomalacia in the right posterior parietal lobe appears unchanged. Chronic lacunar type infarcts in the basal ganglia and cerebellum. No evidence of acute large vascular territory infarction, parenchymal hemorrhage, hydrocephalus, or mass lesion. Vascular: No hyperdense vessel. Atherosclerotic calcifications of the internal carotid arteries at the skull base. Skull: Normal. Negative for fracture or focal lesion. Sinuses/Orbits: Visualized portions of the paranasal sinuses and ethmoid air cells are predominantly clear. Prior bilateral lens surgery. Other: None IMPRESSION: 1. Acute on chronic right whole hemispheric collection measuring up to 7 mm. There is a hyperdense focus along the inferior aspect of the collection which likely represents a small amount of acute blood products. Minimal local mass effect. 2. Age related global parenchymal volume loss and moderate to severe chronic ischemic small vessel white matter disease. 3. Encephalomalacia in the right posterior parietal lobe appears unchanged. Chronic lacunar type infarcts in the basal ganglia and cerebellum.  These results were called by telephone at the time of interpretation on 01/29/2021 at 5:27 pm to provider Merlyn Lot , who verbally acknowledged these results. Electronically Signed   By: Dahlia Bailiff MD   On: 01/29/2021 17:28   DG Chest Portable 1 View  Result Date: 01/29/2021 CLINICAL DATA:  Weakness concern for infection. EXAM: PORTABLE CHEST 1 VIEW COMPARISON:  CT abdomen from same day. FINDINGS: The heart size and mediastinal contours are within normal limits. Both lungs are clear. Extensive sclerosis of the visualized osseous structures, suggestive of diffuse metastatic disease in this patient with known prostate cancer. IMPRESSION: 1. No acute cardiopulmonary disease. 2. Extensive sclerosis of the visualized osseous structures, suggestive of diffuse metastatic disease in this patient with known prostate cancer. Electronically Signed   By: Dahlia Bailiff MD   On: 01/29/2021 17:30   CT BONE MARROW BIOPSY & ASPIRATION  Result Date: 01/10/2021 CLINICAL DATA:  Anemia and thrombocytopenia. EXAM: CT GUIDED BONE MARROW ASPIRATION AND BIOPSY ANESTHESIA/SEDATION: Versed 1.0 mg IV, Fentanyl 50 mcg IV Total Moderate Sedation Time:   14 minutes. The patient's level of consciousness and physiologic status were continuously monitored during the procedure by Radiology nursing. PROCEDURE: The procedure  risks, benefits, and alternatives were explained to the patient. Questions regarding the procedure were encouraged and answered. The patient understands and consents to the procedure. A time out was performed prior to initiating the procedure. The right gluteal region was prepped with chlorhexidine. Sterile gown and sterile gloves were used for the procedure. Local anesthesia was provided with 1% Lidocaine. Under CT guidance, an 11 gauge On Control bone cutting needle was advanced from a posterior approach into the right iliac bone. Needle positioning was confirmed with CT. Initial non heparinized and heparinized  aspirate samples were obtained of bone marrow. Core biopsy was performed via the On Control drill needle. COMPLICATIONS: None FINDINGS: Initial imaging of the lower lumbar spine, sacrum and bony pelvis demonstrates diffuse abnormal sclerosis of all of the visualized bony structures. Findings are suggestive of either diffuse sclerotic metastatic disease or a diffuse bone marrow process. Given sclerosis, metastatic prostate carcinoma would be a concern. Initial aspiration yielded very limited return of liquified marrow. An intact core biopsy sample was able to be obtained. IMPRESSION: 1. CT guided bone marrow biopsy of right posterior iliac bone with both aspirate and core samples obtained. Aspirate sample was very limited with a nearly dry tap. 2. Diffuse abnormal sclerosis of all bony structures visualized including the lower lumbar spine, sacrum and bilateral iliac bones. Findings may be consistent with diffuse sclerotic metastatic disease or a diffuse bone marrow process. Metastatic prostate carcinoma would be of concern and a PSA level was drawn following the biopsy procedure. Electronically Signed   By: Aletta Edouard M.D.   On: 01/10/2021 15:28    Prostate cancer metastatic to multiple sites [facility]) #87 year old male patient newly diagnosed metastatic prostate cancer; is currently admitted to hospital for acute delirium/mental status changes  # Metastatic prostate cancer to the bone/bone marrow-stage IV; castrate sensitive.  Patient s/p Firmagon [antihormone therapy/androgen deprivation therapy] approximate we will check PSA.  #Pancytopenia-severe anemia; secondary to bone marrow infiltration with prostate cancer. -Hemoglobin today is 7.1.  With PRBC transfusion.  #Mental status changes: Acute delirium-CT scan acute small on chronic subdural hematoma-question s/p fall.  Question cause the patient's mental status changes.  Patient is not currently agitated but definitely confused.  Continue supportive  care  #Chronic kidney disease-stage III clinically stable.  #Left a message for patient's son Gerald Stabs to discuss the above plan of care.  Thank you Dr. Jimmye Norman for allowing me to participate in the care of your pleasant patient. Please do not hesitate to contact me with questions or concerns in the interim.  Addendum: I had a long discussion with the patient's son [>20 mins]-over the phone.  Discussed that the etiology of patient's acute delirium is unclear; however patient seems to be less agitated; although he seems to be pleasantly confused.  Discussed wide range of etiologies that could have caused patient's acute delirium-however no one etiology noted. Discussed multiple scenarios including continue current scope of care vs. Hospice [in the setting of patient's home; at a skilled facility; hospice home].  I encouraged the patient's son Gerald Stabs to talk to his rest of the family regarding our extensive discussion.  Also recommend palliative care evaluation on 7/18 with [facility]; and this would also give the patient's son and family to make a decision regarding patient's care.  Discussed with Dr. Jimmye Norman.    All questions were answered. The patient knows to call the clinic with any problems, questions or concerns.    Cammie Sickle, MD 02/02/2021 5:58 PM

## 2021-02-02 NOTE — Progress Notes (Signed)
PROGRESS NOTE    Taylor Hogan  QQV:[id number] DOB: [birth year] DOA: 01/29/2021 PCP: System, Provider Not In    Assessment & Plan:   Principal Problem:   Acute metabolic encephalopathy Active Problems:   Mild cognitive impairment   Prostate cancer metastatic to multiple sites [facility])   Acute kidney injury superimposed on CKD ([place])   Intracranial hematoma (HCC)   Intracranial hemorrhage (HCC)   Protein-calorie malnutrition, severe   Acute metabolic encephalopathy: likely secondary intracranial hemorrhage vs worsening dementia. D/c IVFs. Oriented to person only today   Acute on chronic subdural hematoma: etiology unclear, possibly a fall at home. No surgical intervention or f/u needed as per neuro surg. Neuro surg recs apprec  AKI on CKDIIIb: Cr is labile. Will continue to monitor   Hyperkalemia: resolved   Thrombocytopenia: labile. Will continue to monitor   Macrocytic anemia: will give 1 unit of pRBCs. Repeat H&H 4 hours post transfusion   Metastatic prostate cancer: w/ mets to bone. Therapy as per onco. Onco consulted and recs apprec   HTN: continue to hold home dose of lisinopril   Depression: severity unknown. Continue on home dose of sertraline   Mild cognitive impairment: continue w/ supportive care   DVT prophylaxis: SCDs Code Status: full Family Communication: discussed pt's care w/ pt's son, Gerald Stabs and answered his questions  Disposition Plan: likely d/c to SNF   Level of care: Med-Surg  Status is: Inpatient  Remains inpatient appropriate because:Inpatient level of care appropriate due to severity of illness  Dispo: The patient is from: Home              Anticipated d/c is to: SNF              Patient currently: is not medically stable for d/c   Difficult to place patient : unclear   Consultants:  Neuro surg  Onco   Procedures:   Antimicrobials:    Subjective: Pt c/o fatigue   Objective: Vitals:   02/01/21 1559 02/01/21 2000 02/01/21 2348  02/02/21 0420  BP: 130/81 127/68 117/62 122/67  Pulse: (!) 105 76 68 68  Resp: 16 17 17 16   Temp: 98.4 F (36.9 C) 98 F (36.7 C) 97.9 F (36.6 C) 98 F (36.7 C)  TempSrc: Oral Oral Oral   SpO2: 100% 100% 100% 100%  Weight:      Height:        Intake/Output Summary (Last 24 hours) at 02/02/2021 0722 Last data filed at 02/01/2021 2051 Gross per 24 hour  Intake 118 ml  Output 150 ml  Net -32 ml   Filed Weights   01/29/21 1356 01/29/21 2313  Weight: 56 kg 52.4 kg    Examination:  General exam: Appears calm & comfortable  Respiratory system: clear breath sounds b/l. No wheezes Cardiovascular system: S1/S2+. No rubs or gallops  Gastrointestinal system: Abd is soft, NT, ND & hypoactive bowel sounds  Central nervous system: Alert and awake. Moves all extremities  Psychiatry: judgement and insight appears abnormal. Flat mood and affect     Data Reviewed: I have personally reviewed following labs and imaging studies  CBC: Recent Labs  Lab 01/29/21 1406 01/30/21 0359 01/31/21 0926 01/31/21 1349 02/01/21 0205 02/02/21 0430  WBC 10.9* 7.7 11.2*  --  9.5 7.4  HGB 8.7* 7.6* 8.2* 8.0* 7.6* 7.1*  HCT 23.4* 21.3* 25.6* 24.1* 23.3* 16.5*  MCV 97.5 99.5 93.8  --  97.9 99.4  PLT 145* 106* 110*  --  119* 109*  Basic Metabolic Panel: Recent Labs  Lab 01/29/21 1406 01/30/21 0359 01/31/21 0926 02/01/21 0205 02/01/21 1806 02/02/21 0430  NA 143 145 144 141  --  143  K 4.8 4.6 4.7 5.4* 5.4* 5.1  CL 115* 118* 119* 119*  --  118*  CO2 19* 19* 18* 20*  --  20*  GLUCOSE 109* 92 102* 124*  --  112*  BUN 119* 103* 71* 60*  --  51*  CREATININE 2.07* 1.70* 1.45* 1.47*  --  1.39*  CALCIUM 8.1* 8.1* 8.1* 8.2*  --  8.1*   GFR: Estimated Creatinine Clearance: 27.2 mL/min (A) (by C-G formula based on SCr of 1.39 mg/dL (H)). Liver Function Tests: Recent Labs  Lab 01/29/21 1516 01/30/21 0359  AST 25 25  ALT 27 21  ALKPHOS 446* 372*  BILITOT 0.8 0.8  PROT 7.2 5.6*  ALBUMIN  3.4* 2.7*   No results for input(s): LIPASE, AMYLASE in the last 168 hours. No results for input(s): AMMONIA in the last 168 hours. Coagulation Profile: No results for input(s): INR, PROTIME in the last 168 hours. Cardiac Enzymes: Recent Labs  Lab 01/29/21 1516  CKTOTAL 26*   BNP (last 3 results) No results for input(s): PROBNP in the last 8760 hours. HbA1C: No results for input(s): HGBA1C in the last 72 hours. CBG: No results for input(s): GLUCAP in the last 168 hours. Lipid Profile: No results for input(s): CHOL, HDL, LDLCALC, TRIG, CHOLHDL, LDLDIRECT in the last 72 hours. Thyroid Function Tests: No results for input(s): TSH, T4TOTAL, FREET4, T3FREE, THYROIDAB in the last 72 hours. Anemia Panel: No results for input(s): VITAMINB12, FOLATE, FERRITIN, TIBC, IRON, RETICCTPCT in the last 72 hours. Sepsis Labs: Recent Labs  Lab 01/29/21 1516  LATICACIDVEN 2.0*    Recent Results (from the past 240 hour(s))  Resp Panel by RT-PCR (Flu A&B, Covid) Nasopharyngeal Swab     Status: None   Collection Time: 01/29/21  3:16 PM   Specimen: Nasopharyngeal Swab; Nasopharyngeal(NP) swabs in vial transport medium  Result Value Ref Range Status   SARS Coronavirus 2 by RT PCR NEGATIVE NEGATIVE Final    Comment: (NOTE) SARS-CoV-2 target nucleic acids are NOT DETECTED.  The SARS-CoV-2 RNA is generally detectable in upper respiratory specimens during the acute phase of infection. The lowest concentration of SARS-CoV-2 viral copies this assay can detect is 138 copies/mL. A negative result does not preclude SARS-Cov-2 infection and should not be used as the sole basis for treatment or other patient management decisions. A negative result may occur with  improper specimen collection/handling, submission of specimen other than nasopharyngeal swab, presence of viral mutation(s) within the areas targeted by this assay, and inadequate number of viral copies(<138 copies/mL). A negative result must  be combined with clinical observations, patient history, and epidemiological information. The expected result is Negative.  Fact Sheet for Patients:  EntrepreneurPulse.com.au  Fact Sheet for Healthcare Providers:  IncredibleEmployment.be  This test is no t yet approved or cleared by the [place] FDA and  has been authorized for detection and/or diagnosis of SARS-CoV-2 by FDA under an Emergency Use Authorization (EUA). This EUA will remain  in effect (meaning this test can be used) for the duration of the COVID-19 declaration under Section 564(b)(1) of the Act, 21 U.S.C.section 360bbb-3(b)(1), unless the authorization is terminated  or revoked sooner.       Influenza A by PCR NEGATIVE NEGATIVE Final   Influenza B by PCR NEGATIVE NEGATIVE Final    Comment: (NOTE) The Xpert Xpress  SARS-CoV-2/FLU/RSV plus assay is intended as an aid in the diagnosis of influenza from Nasopharyngeal swab specimens and should not be used as a sole basis for treatment. Nasal washings and aspirates are unacceptable for Xpert Xpress SARS-CoV-2/FLU/RSV testing.  Fact Sheet for Patients: EntrepreneurPulse.com.au  Fact Sheet for Healthcare Providers: IncredibleEmployment.be  This test is not yet approved or cleared by the [place] FDA and has been authorized for detection and/or diagnosis of SARS-CoV-2 by FDA under an Emergency Use Authorization (EUA). This EUA will remain in effect (meaning this test can be used) for the duration of the COVID-19 declaration under Section 564(b)(1) of the Act, 21 U.S.C. section 360bbb-3(b)(1), unless the authorization is terminated or revoked.  Performed at [facility], [address], [place], [place] [address]          Radiology Studies: No results found.      Scheduled Meds:  feeding supplement  237 mL Oral TID BM   finasteride  5 mg Oral Daily   megestrol   40 mg Oral Daily   multivitamin with minerals  1 tablet Oral Daily   predniSONE  20 mg Oral Q breakfast   sertraline  50 mg Oral Daily   Continuous Infusions:     LOS: 3 days    Time spent: 30 mins     Wyvonnia Dusky, MD [facility] Pager [phone]  If 7PM-7AM, please contact night-coverage 02/02/2021, 7:22 AM

## 2021-02-02 NOTE — Assessment & Plan Note (Addendum)
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  Patient s/p [place] therapy/androgen deprivation therapy]; PSA improved-a 27; baseline 55 in June 2022.  See discussion below  #Pancytopenia-severe anemia; secondary to bone marrow infiltration with prostate cancer.  S/p PRBC transfusion-clinically stable  #Mental status changes: Acute delirium-CT scan acute small on chronic subdural hematoma-question s/p fall.  Still continues/delirium.  #Discussed with Taylor Hogan at length.  As per the discussion with family/son-plan is to discharge patient home with hospice.  I think is very reasonable especially given the significant decline in the last few weeks.  I left a message for the patient's son Taylor Hogan-again reviewed my recommendations/and answering questions.

## 2021-02-02 NOTE — TOC Progression Note (Signed)
Transition of Care [facility]) - Progression Note    Patient Details  Name: Taylor Hogan MRN: [id number] Date of Birth: 08-Oct-1929  Transition of Care [facility]) CM/SW Contact  Elliot Gurney [place], [place] Phone Number: [phone] 02/02/2021, 1:43 PM  Clinical Narrative:    Phone call to patient's son to review current bed offers. Patient currently has bed offers for [employer], [employer] and [employer]. Patient's son would like to consider the above options but would also like to to see if there are any local bed offers as patient's family resides in [place].  Transition of Care will continue to follow and will provide additional bed offers for review when received.   [address], LCSW Transition of Care [phone]    Expected Discharge Plan: [place] Barriers to Discharge: Continued Medical Work up  Expected Discharge Plan and Services Expected Discharge Plan: [place]   Discharge Planning Services: CM Consult Post Acute Care Choice: [place] Living arrangements for the past 2 months: Single Family Home Expected Discharge Date: 02/04/21               DME Arranged: N/A DME Agency: NA       HH Arranged: NA           Social Determinants of Health (SDOH) Interventions    Readmission Risk Interventions No flowsheet data found.

## 2021-02-03 DIAGNOSIS — G9341 Metabolic encephalopathy: Secondary | ICD-10-CM | POA: Diagnosis not present

## 2021-02-03 LAB — BASIC METABOLIC PANEL
Anion gap: 4 — ABNORMAL LOW (ref 5–15)
BUN: 51 mg/dL — ABNORMAL HIGH (ref 8–23)
CO2: 21 mmol/L — ABNORMAL LOW (ref 22–32)
Calcium: 7.9 mg/dL — ABNORMAL LOW (ref 8.9–10.3)
Chloride: 118 mmol/L — ABNORMAL HIGH (ref 98–111)
Creatinine, Ser: 1.35 mg/dL — ABNORMAL HIGH (ref 0.61–1.24)
GFR, Estimated: 50 mL/min — ABNORMAL LOW (ref >60)
Glucose, Bld: 96 mg/dL (ref 70–99)
Potassium: 5 mmol/L (ref 3.5–5.1)
Sodium: 143 mmol/L (ref 135–145)

## 2021-02-03 LAB — CBC
HCT: 27 % — ABNORMAL LOW (ref 39.0–52.0)
HCT: 27.5 % — ABNORMAL LOW (ref 39.0–52.0)
Hemoglobin: 8.1 g/dL — ABNORMAL LOW (ref 13.0–17.0)
Hemoglobin: 9.2 g/dL — ABNORMAL LOW (ref 13.0–17.0)
MCH: 30 pg (ref 26.0–34.0)
MCH: 30.8 pg (ref 26.0–34.0)
MCHC: 30 g/dL (ref 30.0–36.0)
MCHC: 33.5 g/dL (ref 30.0–36.0)
MCV: 99.4 fL (ref 80.0–100.0)
MCV: 92 fL (ref 80.0–100.0)
Platelets: 109 10*3/uL — ABNORMAL LOW (ref 150–400)
Platelets: 103 10*3/uL — ABNORMAL LOW (ref 150–400)
RBC: 2.7 MIL/uL — ABNORMAL LOW (ref 4.22–5.81)
RBC: 2.99 MIL/uL — ABNORMAL LOW (ref 4.22–5.81)
RDW: 19.1 % — ABNORMAL HIGH (ref 11.5–15.5)
RDW: 18.5 % — ABNORMAL HIGH (ref 11.5–15.5)
WBC: 7.4 10*3/uL (ref 4.0–10.5)
WBC: 8.1 10*3/uL (ref 4.0–10.5)
nRBC: 2.4 % — ABNORMAL HIGH (ref 0.0–0.2)
nRBC: 2.4 % — ABNORMAL HIGH (ref 0.0–0.2)

## 2021-02-03 LAB — HEMOGLOBIN AND HEMATOCRIT, BLOOD
HCT: 30.7 % — ABNORMAL LOW (ref 39.0–52.0)
Hemoglobin: 9.9 g/dL — ABNORMAL LOW (ref 13.0–17.0)

## 2021-02-03 LAB — TYPE AND SCREEN
ABO/RH(D): O NEG
Antibody Screen: NEGATIVE
Unit division: 0

## 2021-02-03 LAB — BPAM RBC
Blood Product Expiration Date: 202207292359
ISSUE DATE / TIME: 202207161238
Unit Type and Rh: 9500

## 2021-02-03 LAB — PSA: Prostatic Specific Antigen: 25.64 ng/mL — ABNORMAL HIGH (ref 0.00–4.00)

## 2021-02-03 MED ORDER — POLYVINYL ALCOHOL 1.4 % OP SOLN
1.0000 [drp] | OPHTHALMIC | Status: DC | PRN
  Filled 2021-02-03: qty 15

## 2021-02-03 NOTE — Progress Notes (Signed)
PROGRESS NOTE    Taylor Hogan  PTW:[id number] DOB: 1929-07-31 DOA: 01/29/2021 PCP: System, Provider Not In    Assessment & Plan:   Principal Problem:   Acute metabolic encephalopathy Active Problems:   Mild cognitive impairment   Prostate cancer metastatic to multiple sites [facility])   Acute kidney injury superimposed on CKD ([place])   Intracranial hematoma (HCC)   Intracranial hemorrhage (HCC)   Protein-calorie malnutrition, severe   Acute metabolic encephalopathy: mental status is labile. Likely secondary intracranial hemorrhage vs worsening dementia. D/c IVFs.  Acute on chronic subdural hematoma: etiology unclear, possibly a fall at home. No surgical intervention or f/u needed as per neuro surg. Neuro surg recs apprec  AKI on CKDIIIb: Cr is labile. Avoid nephrotoxic meds   Hyperkalemia: resolved   Thrombocytopenia: labile. Will continue to monitor   Macrocytic anemia: s/p 1 unit of pRBCs transfused. H&H are labile   Metastatic prostate cancer: w/ mets to bone. Therapy as per onco. Onco consulted and recs apprec   HTN: continue to hold home dose of ACE-I   Depression: severity unknown. Continue on home dose of sertraline   Mild cognitive impairment: continue w/ supportive care    DVT prophylaxis: SCDs Code Status: full Family Communication:  Disposition Plan: likely d/c to SNF   Level of care: Med-Surg  Status is: Inpatient  Remains inpatient appropriate because:Inpatient level of care appropriate due to severity of illness, palliative care will see the pt tomorrow   Dispo: The patient is from: Home              Anticipated d/c is to: SNF              Patient currently: is not medically stable for d/c   Difficult to place patient : unclear   Consultants:  Neuro surg  Onco    Procedures:   Antimicrobials:    Subjective: Pt c/o malaise   Objective: Vitals:   02/02/21 1652 02/02/21 1946 02/03/21 0406 02/03/21 0716  BP: (!) 115/55 131/68 (!) 107/55  115/66  Pulse: 72 67 65 70  Resp: 18 18 18 18   Temp: 98.3 F (36.8 C) 98.3 F (36.8 C) 98.1 F (36.7 C) 97.6 F (36.4 C)  TempSrc: Oral     SpO2: 100% 100% 99% 100%  Weight:      Height:        Intake/Output Summary (Last 24 hours) at 02/03/2021 0743 Last data filed at 02/02/2021 1847 Gross per 24 hour  Intake 975 ml  Output 50 ml  Net 925 ml   Filed Weights   01/29/21 1356 01/29/21 2313  Weight: 56 kg 52.4 kg    Examination:  General exam: Appears comfortable. Frail appearing  Respiratory system: clear breath sounds b/l  Cardiovascular system: S1/S2+. No rubs or clicks  Gastrointestinal system: Abd is soft, NT, ND & hypoactive bowel sounds  Central nervous system: Lethargic. Moves all extremities  Psychiatry: judgement and insight appears abnormal. Flat mood and affect    Data Reviewed: I have personally reviewed following labs and imaging studies  CBC: Recent Labs  Lab 01/30/21 0359 01/31/21 0926 01/31/21 1349 02/01/21 0205 02/02/21 0430 02/02/21 1947 02/03/21 0426  WBC 7.7 11.2*  --  9.5 7.4  --  8.1  HGB 7.6* 8.2* 8.0* 7.6* 7.1* 9.9* 9.2*  HCT 21.3* 25.6* 24.1* 23.3* 16.5* 23.0* 27.5*  MCV 99.5 93.8  --  97.9 99.4  --  92.0  PLT 106* 110*  --  119* 109*  --  169*   Basic Metabolic Panel: Recent Labs  Lab 01/30/21 0359 01/31/21 0926 02/01/21 0205 02/01/21 1806 02/02/21 0430 02/03/21 0426  NA 145 144 141  --  143 143  K 4.6 4.7 5.4* 5.4* 5.1 5.0  CL 118* 119* 119*  --  118* 118*  CO2 19* 18* 20*  --  20* 21*  GLUCOSE 92 102* 124*  --  112* 96  BUN 103* 71* 60*  --  51* 51*  CREATININE 1.70* 1.45* 1.47*  --  1.39* 1.35*  CALCIUM 8.1* 8.1* 8.2*  --  8.1* 7.9*   GFR: Estimated Creatinine Clearance: 28 mL/min (A) (by C-G formula based on SCr of 1.35 mg/dL (H)). Liver Function Tests: Recent Labs  Lab 01/29/21 1516 01/30/21 0359  AST 25 25  ALT 27 21  ALKPHOS 446* 372*  BILITOT 0.8 0.8  PROT 7.2 5.6*  ALBUMIN 3.4* 2.7*   No results for  input(s): LIPASE, AMYLASE in the last 168 hours. No results for input(s): AMMONIA in the last 168 hours. Coagulation Profile: No results for input(s): INR, PROTIME in the last 168 hours. Cardiac Enzymes: Recent Labs  Lab 01/29/21 1516  CKTOTAL 26*   BNP (last 3 results) No results for input(s): PROBNP in the last 8760 hours. HbA1C: No results for input(s): HGBA1C in the last 72 hours. CBG: No results for input(s): GLUCAP in the last 168 hours. Lipid Profile: No results for input(s): CHOL, HDL, LDLCALC, TRIG, CHOLHDL, LDLDIRECT in the last 72 hours. Thyroid Function Tests: No results for input(s): TSH, T4TOTAL, FREET4, T3FREE, THYROIDAB in the last 72 hours. Anemia Panel: No results for input(s): VITAMINB12, FOLATE, FERRITIN, TIBC, IRON, RETICCTPCT in the last 72 hours. Sepsis Labs: Recent Labs  Lab 01/29/21 1516  LATICACIDVEN 2.0*    Recent Results (from the past 240 hour(s))  Resp Panel by RT-PCR (Flu A&B, Covid) Nasopharyngeal Swab     Status: None   Collection Time: 01/29/21  3:16 PM   Specimen: Nasopharyngeal Swab; Nasopharyngeal(NP) swabs in vial transport medium  Result Value Ref Range Status   SARS Coronavirus 2 by RT PCR NEGATIVE NEGATIVE Final    Comment: (NOTE) SARS-CoV-2 target nucleic acids are NOT DETECTED.  The SARS-CoV-2 RNA is generally detectable in upper respiratory specimens during the acute phase of infection. The lowest concentration of SARS-CoV-2 viral copies this assay can detect is 138 copies/mL. A negative result does not preclude SARS-Cov-2 infection and should not be used as the sole basis for treatment or other patient management decisions. A negative result may occur with  improper specimen collection/handling, submission of specimen other than nasopharyngeal swab, presence of viral mutation(s) within the areas targeted by this assay, and inadequate number of viral copies(<138 copies/mL). A negative result must be combined with clinical  observations, patient history, and epidemiological information. The expected result is Negative.  Fact Sheet for Patients:  EntrepreneurPulse.com.au  Fact Sheet for Healthcare Providers:  IncredibleEmployment.be  This test is no t yet approved or cleared by the [place] FDA and  has been authorized for detection and/or diagnosis of SARS-CoV-2 by FDA under an Emergency Use Authorization (EUA). This EUA will remain  in effect (meaning this test can be used) for the duration of the COVID-19 declaration under Section 564(b)(1) of the Act, 21 U.S.C.section 360bbb-3(b)(1), unless the authorization is terminated  or revoked sooner.       Influenza A by PCR NEGATIVE NEGATIVE Final   Influenza B by PCR NEGATIVE NEGATIVE Final    Comment: (NOTE)  The Xpert Xpress SARS-CoV-2/FLU/RSV plus assay is intended as an aid in the diagnosis of influenza from Nasopharyngeal swab specimens and should not be used as a sole basis for treatment. Nasal washings and aspirates are unacceptable for Xpert Xpress SARS-CoV-2/FLU/RSV testing.  Fact Sheet for Patients: EntrepreneurPulse.com.au  Fact Sheet for Healthcare Providers: IncredibleEmployment.be  This test is not yet approved or cleared by the [place] FDA and has been authorized for detection and/or diagnosis of SARS-CoV-2 by FDA under an Emergency Use Authorization (EUA). This EUA will remain in effect (meaning this test can be used) for the duration of the COVID-19 declaration under Section 564(b)(1) of the Act, 21 U.S.C. section 360bbb-3(b)(1), unless the authorization is terminated or revoked.  Performed at [facility], [address], [place], [place] [address]          Radiology Studies: No results found.      Scheduled Meds:  feeding supplement  237 mL Oral TID BM   finasteride  5 mg Oral Daily   megestrol  40 mg Oral Daily    multivitamin with minerals  1 tablet Oral Daily   Continuous Infusions:     LOS: 4 days    Time spent: 30 mins     Wyvonnia Dusky, MD [facility] Pager [phone]  If 7PM-7AM, please contact night-coverage 02/03/2021, 7:43 AM

## 2021-02-03 NOTE — Progress Notes (Signed)
Taylor Hogan   DOB:December 31, 1929   OB#:[id number]    Subjective: Patient sleeping; difficult to arouse.  However as per the sitter this morning patient had walked-in the hallways.  And appeared to be more lucid/and coherent.    Objective:  Vitals:   02/03/21 0716 02/03/21 1333  BP: 115/66 (!) 111/54  Pulse: 70 76  Resp: 18 18  Temp: 97.6 F (36.4 C) 98 F (36.7 C)  SpO2: 100% 99%     Intake/Output Summary (Last 24 hours) at 02/03/2021 2043 Last data filed at 02/03/2021 2040 Gross per 24 hour  Intake 150 ml  Output --  Net 150 ml    Physical Exam Vitals and nursing note reviewed.  Constitutional:      Comments: Patient resting in the bed-sleeping; accompanied by sitter.  Appears cachectic  HENT:     Head: Normocephalic and atraumatic.     Mouth/Throat:     Mouth: Mucous membranes are moist.     Pharynx: No oropharyngeal exudate.  Eyes:     Pupils: Pupils are equal, round, and reactive to light.  Cardiovascular:     Rate and Rhythm: Normal rate and regular rhythm.  Pulmonary:     Effort: No respiratory distress.     Breath sounds: No wheezing.     Comments: Decreased breath sounds bilaterally at bases.  No wheeze or crackles Abdominal:     General: Bowel sounds are normal. There is no distension.     Palpations: Abdomen is soft. There is no mass.     Tenderness: There is no abdominal tenderness. There is no guarding or rebound.  Musculoskeletal:        General: No tenderness. Normal range of motion.     Cervical back: Normal range of motion and neck supple.  Skin:    General: Skin is warm.  Neurological:     Comments: Confused oriented 1/however had to be woken up from sleep  Psychiatric:        Mood and Affect: Affect normal.        Judgment: Judgment normal.     Labs:  Lab Results  Component Value Date   WBC 8.1 02/03/2021   HGB 9.2 (L) 02/03/2021   HCT 27.5 (L) 02/03/2021   MCV 92.0 02/03/2021   PLT 103 (L) 02/03/2021   NEUTROABS 2.6 01/17/2021    Lab  Results  Component Value Date   NA 143 02/03/2021   K 5.0 02/03/2021   CL 118 (H) 02/03/2021   CO2 21 (L) 02/03/2021    Studies:  No results found.  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  Patient s/p [place] therapy/androgen deprivation therapy]; PSA improved-a 27; baseline 55 in June 2022.  #Pancytopenia-severe anemia; secondary to bone marrow infiltration with prostate cancer.  S/p PRBC transfusion hemoglobin today is 9.2.  #Mental status changes: Acute delirium-CT scan acute small on chronic subdural hematoma-question s/p fall.  As per the sitter patient is more lucid today; however intermittently confused.   #Chronic kidney disease-stage III clinically stable.  #Await evaluation with cardiac care tomorrow.  We will discuss with son tomorrow-review disposition/further plan of care-after evaluation with cardiac care.   Cammie Sickle, MD 02/03/2021  8:43 PM

## 2021-02-03 NOTE — Progress Notes (Signed)
NT stated patient ate a bite of macaroni and cheese  and drank some of his ensure during day shift.  Patient also had a Bowel movement and voided.  Not noted in his chart.

## 2021-02-03 NOTE — Progress Notes (Signed)
PT Cancellation Note  Patient Details Name: Taylor Hogan MRN: [id number] DOB: [birth year]   Cancelled Treatment:    Reason Eval/Treat Not Completed: Fatigue/lethargy limiting ability to participate  Multiple attempts this am.  Pt sleeping each attempt. Sitter in room.  Stated she was able to walk with him earlier and completed a full lap around unit this am and overall she felt he did well.  Will continue attempts tomorrow.    Chesley Noon 02/03/2021, 11:14 AM

## 2021-02-04 DIAGNOSIS — G9341 Metabolic encephalopathy: Secondary | ICD-10-CM | POA: Diagnosis not present

## 2021-02-04 LAB — BASIC METABOLIC PANEL
Anion gap: 6 (ref 5–15)
BUN: 65 mg/dL — ABNORMAL HIGH (ref 8–23)
CO2: 21 mmol/L — ABNORMAL LOW (ref 22–32)
Calcium: 8.2 mg/dL — ABNORMAL LOW (ref 8.9–10.3)
Chloride: 113 mmol/L — ABNORMAL HIGH (ref 98–111)
Creatinine, Ser: 1.4 mg/dL — ABNORMAL HIGH (ref 0.61–1.24)
GFR, Estimated: 48 mL/min — ABNORMAL LOW (ref >60)
Glucose, Bld: 96 mg/dL (ref 70–99)
Potassium: 5 mmol/L (ref 3.5–5.1)
Sodium: 140 mmol/L (ref 135–145)

## 2021-02-04 LAB — CBC
HCT: 27.2 % — ABNORMAL LOW (ref 39.0–52.0)
Hemoglobin: 9.2 g/dL — ABNORMAL LOW (ref 13.0–17.0)
MCH: 31.8 pg (ref 26.0–34.0)
MCHC: 33.8 g/dL (ref 30.0–36.0)
MCV: 94.1 fL (ref 80.0–100.0)
Platelets: 97 10*3/uL — ABNORMAL LOW (ref 150–400)
RBC: 2.89 MIL/uL — ABNORMAL LOW (ref 4.22–5.81)
RDW: 18.6 % — ABNORMAL HIGH (ref 11.5–15.5)
WBC: 6.5 10*3/uL (ref 4.0–10.5)
nRBC: 1.7 % — ABNORMAL HIGH (ref 0.0–0.2)

## 2021-02-04 MED ORDER — ACETAMINOPHEN-CODEINE #3 300-30 MG PO TABS: 1.0000 | ORAL_TABLET | Freq: Once | ORAL | Status: DC

## 2021-02-04 NOTE — Progress Notes (Signed)
OT Cancellation Note  Patient Details Name: Taylor Hogan MRN: [id number] DOB: 09-10-1929   Cancelled Treatment:    Reason Eval/Treat Not Completed: Fatigue/lethargy limiting ability to participate. Pt very lethargic and in bed sleeping soundly. Sitter in room reports that pt has been up all night and is just now going to sleep. OT will re-attempt as time allows.  Darleen Crocker, MS, OTR/L , CBIS ascom [phone]  02/04/21, 10:06 AM

## 2021-02-04 NOTE — Progress Notes (Signed)
Physical Therapy Treatment Patient Details Name: Taylor Hogan MRN: [id number] DOB: 02-Jun-1930 Today's Date: 02/04/2021    History of Present Illness Pt is an 87 y.o. male admitted to hospital 7/12 with decreased p.o. intake, weakness, and increased agitation at home.  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  PMH includes anxiety, skin CA, prostate CA on chemotherapy, metastatic disease to bone, CKD, seizure like activity, and h/o stroke.    PT Comments    Sitter called to say pt was awake.  Ready to walk.  Mod a x 1 for tactile cues to initiate OOB on left side.  Stood with min guard and is able to walk with slow shuffling gait 120' with cues to keep walker closer and increase step height and length.  Remained in recliner after session to eat ice cream.  Mobility seems to vary depending on time of day/night.  Was able to walk an extended period of time last night but did demonstrate decreased gait quality and fatigued this session but is improved over prior PT sessions.    Follow Up Recommendations  SNF     Equipment Recommendations  Rolling walker with 5" wheels;3in1 (PT)    Recommendations for Other Services OT consult     Precautions / Restrictions Precautions Precautions: Fall Precaution Comments: Metastatic disease to bones; small R SDH Restrictions Weight Bearing Restrictions: No    Mobility  Bed Mobility Overal bed mobility: Needs Assistance Bed Mobility: Supine to Sit     Supine to sit: Min assist     General bed mobility comments: cues to initiate movement    Transfers Overall transfer level: Needs assistance Equipment used: Rolling walker (2 wheeled) Transfers: Sit to/from Stand Sit to Stand: Min  guard            Ambulation/Gait Ambulation/Gait assistance: Min guard;Min Web designer (Feet): 120 Feet Assistive device: Rolling walker (2 wheeled) Gait Pattern/deviations: Step-through pattern;Decreased step length - right;Decreased step length - left Gait velocity: decreased   General Gait Details: shufflng gait with decreased step height and length   Stairs             Wheelchair Mobility    Modified Rankin (Stroke Patients Only)       Balance Overall balance assessment: Needs assistance Sitting-balance support: No upper extremity supported;Feet supported Sitting balance-Leahy Scale: Good Sitting balance - Comments: steady sitting reaching within BOS   Standing balance support: Bilateral upper extremity supported;During functional activity Standing balance-Leahy Scale: Poor Standing balance comment: CGA to min assist with turns (during walking using RW)                            Cognition Arousal/Alertness: Awake/alert Behavior During Therapy: WFL for tasks assessed/performed Overall Cognitive Status: Within Functional Limits for tasks assessed                                        Exercises      General Comments        Pertinent Vitals/Pain Pain Assessment: No/denies pain    Home Living  Prior Function            PT Goals (current goals can now be found in the care plan section) Progress towards PT goals: Progressing toward goals    Frequency    7X/week      PT Plan Current plan remains appropriate    Co-evaluation              AM-PAC PT "6 Clicks" Mobility   Outcome Measure  Help needed turning from your back to your side while in a flat bed without using bedrails?: None Help needed moving from lying on your back to sitting on the side of a flat bed without using bedrails?: None Help needed moving to and from a bed to a chair (including a wheelchair)?: A  Little Help needed standing up from a chair using your arms (e.g., wheelchair or bedside chair)?: A Little Help needed to walk in hospital room?: A Little Help needed climbing 3-5 steps with a railing? : A Lot 6 Click Score: 19    End of Session Equipment Utilized During Treatment: Gait belt Activity Tolerance: Patient limited by fatigue Patient left: in bed;with call bell/phone within reach;with bed alarm set;Other (comment) (fall mat next to bed) Nurse Communication: Mobility status;Precautions PT Visit Diagnosis: Unsteadiness on feet (R26.81);Other abnormalities of gait and mobility (R26.89);Muscle weakness (generalized) (M62.81)     Time: 5056-9794 PT Time Calculation (min) (ACUTE ONLY): 16 min  Charges:  $Gait Training: 8-22 mins                    Chesley Noon, PTA 02/04/21, 12:47 PM , 12:43 PM

## 2021-02-04 NOTE — Progress Notes (Signed)
PT Cancellation Note  Patient Details Name: Taylor Hogan MRN: [id number] DOB: [birth year]   Cancelled Treatment:    Reason Eval/Treat Not Completed: Fatigue/lethargy limiting ability to participate  Pt asleep on x 2 attempts.  Sitter stating he has a long night up multiple times and was able to walk with sitter for an extended time on unit last night.  He is up to bathroom with staff.  Ascom number left in room for sitter to call if/when pt wakes and gait is appropriate.  Will await call today or continue at a later time/date.   Chesley Noon 02/04/2021, 10:49 AM

## 2021-02-04 NOTE — Plan of Care (Signed)

## 2021-02-04 NOTE — Progress Notes (Signed)
PROGRESS NOTE    Taylor Hogan  TMH:[id number] DOB: 20-Sep-1929 DOA: 01/29/2021 PCP: System, Provider Not In    Assessment & Plan:   Principal Problem:   Acute metabolic encephalopathy Active Problems:   Mild cognitive impairment   Prostate cancer metastatic to multiple sites [facility])   Acute kidney injury superimposed on CKD ([place])   Intracranial hematoma ([facility])   Intracranial hemorrhage ([facility])   Protein-calorie malnutrition, severe   Acute metabolic encephalopathy: mental status is labile. Likely secondary intracranial hemorrhage vs worsening dementia. D/c IVFs.  Acute on chronic subdural hematoma: etiology unclear, possibly a fall at home. No surgical intervention or f/u needed as per neuro surg. Neuro surg recs apprec  AKI on CKDIIIb: Cr is labile. Avoid nephrotoxic meds   Hyperkalemia: resolved   Thrombocytopenia: labile. Will continue to monitor   Macrocytic anemia: H&H are stable. S/p 1 unit of pRBCs transufsed   Metastatic prostate cancer: w/ mets to bone. Therapy as per onco. Onco consulted and recs apprec   HTN: continue to hold home dose of lisinopril   Depression: severity unknown. Continue on home dose of sertraline   Mild cognitive impairment: continue w/ supportive care   DVT prophylaxis: SCDs Code Status: full Family Communication:  Disposition Plan: likely d/c to SNF   Level of care: Med-Surg  Status is: Inpatient  Remains inpatient appropriate because:Inpatient level of care appropriate due to severity of illness,  palliative care unable to see pt today but will see pt tomorrow   Dispo: The patient is from: Home              Anticipated d/c is to: SNF              Patient currently: is not medically stable for d/c   Difficult to place patient : unclear   Consultants:  Neuro surg  Onco    Procedures:   Antimicrobials:    Subjective: Pt c/o fatigue   Objective: Vitals:   02/03/21 1333 02/03/21 2100 02/04/21 0100 02/04/21 0726  BP:  (!) 111/54 120/64 105/61 135/73  Pulse: 76 92 83 86  Resp: 18 18 18 16   Temp: 98 F (36.7 C) 98.2 F (36.8 C) 97.6 F (36.4 C) 98.7 F (37.1 C)  TempSrc: Oral Oral Oral   SpO2: 99% 100% 99% 99%  Weight:      Height:        Intake/Output Summary (Last 24 hours) at 02/04/2021 0735 Last data filed at 02/04/2021 0400 Gross per 24 hour  Intake 720 ml  Output 441 ml  Net 279 ml   Filed Weights   01/29/21 1356 01/29/21 2313  Weight: 56 kg 52.4 kg    Examination:  General exam: Appears calm and comfortable  Respiratory system: clear breath sounds b/l Cardiovascular system: S1/S2+. No rubs or clicks   Gastrointestinal system: Abd is soft, NT, ND & hypoactive bowel sounds  Central nervous system: Lethargic. Moves all extremities  Psychiatry: judgement and insight appears abnormal.Flat mood and affect    Data Reviewed: I have personally reviewed following labs and imaging studies  CBC: Recent Labs  Lab 01/30/21 0359 01/31/21 0926 01/31/21 1349 02/01/21 0205 02/02/21 0430 02/02/21 1947 02/03/21 0426  WBC 7.7 11.2*  --  9.5 7.4  --  8.1  HGB 7.6* 8.2* 8.0* 7.6* 8.1* 9.9* 9.2*  HCT 21.3* 25.6* 24.1* 23.3* 27.0* 30.7* 27.5*  MCV 99.5 93.8  --  97.9 99.4  --  92.0  PLT 106* 110*  --  119*  109*  --  762*   Basic Metabolic Panel: Recent Labs  Lab 01/30/21 0359 01/31/21 0926 02/01/21 0205 02/01/21 1806 02/02/21 0430 02/03/21 0426  NA 145 144 141  --  143 143  K 4.6 4.7 5.4* 5.4* 5.1 5.0  CL 118* 119* 119*  --  118* 118*  CO2 19* 18* 20*  --  20* 21*  GLUCOSE 92 102* 124*  --  112* 96  BUN 103* 71* 60*  --  51* 51*  CREATININE 1.70* 1.45* 1.47*  --  1.39* 1.35*  CALCIUM 8.1* 8.1* 8.2*  --  8.1* 7.9*   GFR: Estimated Creatinine Clearance: 28 mL/min (A) (by C-G formula based on SCr of 1.35 mg/dL (H)). Liver Function Tests: Recent Labs  Lab 01/29/21 1516 01/30/21 0359  AST 25 25  ALT 27 21  ALKPHOS 446* 372*  BILITOT 0.8 0.8  PROT 7.2 5.6*  ALBUMIN 3.4*  2.7*   No results for input(s): LIPASE, AMYLASE in the last 168 hours. No results for input(s): AMMONIA in the last 168 hours. Coagulation Profile: No results for input(s): INR, PROTIME in the last 168 hours. Cardiac Enzymes: Recent Labs  Lab 01/29/21 1516  CKTOTAL 26*   BNP (last 3 results) No results for input(s): PROBNP in the last 8760 hours. HbA1C: No results for input(s): HGBA1C in the last 72 hours. CBG: No results for input(s): GLUCAP in the last 168 hours. Lipid Profile: No results for input(s): CHOL, HDL, LDLCALC, TRIG, CHOLHDL, LDLDIRECT in the last 72 hours. Thyroid Function Tests: No results for input(s): TSH, T4TOTAL, FREET4, T3FREE, THYROIDAB in the last 72 hours. Anemia Panel: No results for input(s): VITAMINB12, FOLATE, FERRITIN, TIBC, IRON, RETICCTPCT in the last 72 hours. Sepsis Labs: Recent Labs  Lab 01/29/21 1516  LATICACIDVEN 2.0*    Recent Results (from the past 240 hour(s))  Resp Panel by RT-PCR (Flu A&B, Covid) Nasopharyngeal Swab     Status: None   Collection Time: 01/29/21  3:16 PM   Specimen: Nasopharyngeal Swab; Nasopharyngeal(NP) swabs in vial transport medium  Result Value Ref Range Status   SARS Coronavirus 2 by RT PCR NEGATIVE NEGATIVE Final    Comment: (NOTE) SARS-CoV-2 target nucleic acids are NOT DETECTED.  The SARS-CoV-2 RNA is generally detectable in upper respiratory specimens during the acute phase of infection. The lowest concentration of SARS-CoV-2 viral copies this assay can detect is 138 copies/mL. A negative result does not preclude SARS-Cov-2 infection and should not be used as the sole basis for treatment or other patient management decisions. A negative result may occur with  improper specimen collection/handling, submission of specimen other than nasopharyngeal swab, presence of viral mutation(s) within the areas targeted by this assay, and inadequate number of viral copies(<138 copies/mL). A negative result must be  combined with clinical observations, patient history, and epidemiological information. The expected result is Negative.  Fact Sheet for Patients:  EntrepreneurPulse.com.au  Fact Sheet for Healthcare Providers:  IncredibleEmployment.be  This test is no t yet approved or cleared by the [place] FDA and  has been authorized for detection and/or diagnosis of SARS-CoV-2 by FDA under an Emergency Use Authorization (EUA). This EUA will remain  in effect (meaning this test can be used) for the duration of the COVID-19 declaration under Section 564(b)(1) of the Act, 21 U.S.C.section 360bbb-3(b)(1), unless the authorization is terminated  or revoked sooner.       Influenza A by PCR NEGATIVE NEGATIVE Final   Influenza B by PCR NEGATIVE NEGATIVE Final  Comment: (NOTE) The Xpert Xpress SARS-CoV-2/FLU/RSV plus assay is intended as an aid in the diagnosis of influenza from Nasopharyngeal swab specimens and should not be used as a sole basis for treatment. Nasal washings and aspirates are unacceptable for Xpert Xpress SARS-CoV-2/FLU/RSV testing.  Fact Sheet for Patients: EntrepreneurPulse.com.au  Fact Sheet for Healthcare Providers: IncredibleEmployment.be  This test is not yet approved or cleared by the [place] FDA and has been authorized for detection and/or diagnosis of SARS-CoV-2 by FDA under an Emergency Use Authorization (EUA). This EUA will remain in effect (meaning this test can be used) for the duration of the COVID-19 declaration under Section 564(b)(1) of the Act, 21 U.S.C. section 360bbb-3(b)(1), unless the authorization is terminated or revoked.  Performed at [facility], [address], [place], [place] [address]          Radiology Studies: No results found.      Scheduled Meds:  acetaminophen-codeine  1 tablet Oral Once   feeding supplement  237 mL Oral TID BM    finasteride  5 mg Oral Daily   megestrol  40 mg Oral Daily   multivitamin with minerals  1 tablet Oral Daily   Continuous Infusions:     LOS: 5 days    Time spent: 20 mins     Wyvonnia Dusky, MD [facility] Pager [phone]  If 7PM-7AM, please contact night-coverage 02/04/2021, 7:35 AM

## 2021-02-04 NOTE — Progress Notes (Signed)
Occupational Therapy Treatment Patient Details Name: Taylor Hogan MRN: [id number] DOB: Sep 10, 1929 Today's Date: 02/04/2021    History of present illness Pt is an 87 y.o. male admitted to hospital 7/12 with decreased p.o. intake, weakness, and increased agitation at home.  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  PMH includes anxiety, skin CA, prostate CA on chemotherapy, metastatic disease to bone, CKD, seizure like activity, and h/o stroke.   OT comments  Multiple attempts made to see pt this morning. This afternoon, pt is supine in bed with sitter starting bed bath. Pt transitioned to OT session and able to wash face and UB with min A to initiate and mod multimodal cuing to sequence and initiate. Pt then gets more restless and calls out he is cold. OT assists pt with more of tasks I order to decrease behaviors. Pt verbalized, " I gotta pee" and OT assisted him out of bed and to the bathroom with min HHA. He began urinating on the floor before getting to toilet. OT assisted pt with hygiene and clothing management and then he returned to bed at end of session. Sitter returns to room as therapist exits.   Follow Up Recommendations  SNF;Supervision/Assistance - 24 hour    Equipment Recommendations  Other (comment) (defer to next venue of care)       Precautions / Restrictions Precautions Precautions: Fall Precaution Comments: Metastatic disease to bones; small R SDH Restrictions Weight Bearing Restrictions: No       Mobility Bed Mobility Overal bed mobility: Needs Assistance Bed Mobility: Supine to Sit     Supine to sit: Min assist Sit to supine: Min assist   General bed mobility comments: cues to initiate movement     Transfers Overall transfer level: Needs assistance Equipment used: 1 person hand held assist Transfers: Sit to/from Stand Sit to Stand: Min assist Stand pivot transfers: Min assist            Balance Overall balance assessment: Needs assistance Sitting-balance support: No upper extremity supported;Feet supported Sitting balance-Leahy Scale: Good Sitting balance - Comments: steady sitting reaching within BOS   Standing balance support: Bilateral upper extremity supported;During functional activity Standing balance-Leahy Scale: Poor Standing balance comment: CGA to min assist with turns (during walking using RW)                           ADL either performed or assessed with clinical judgement   ADL Overall ADL's : Needs assistance/impaired     Grooming: Wash/dry hands;Wash/dry face;Bed level;Supervision/safety Grooming Details (indicate cue type and reason): mod multimodal cuing Upper Body Bathing: Minimal assistance Upper Body Bathing Details (indicate cue type and reason): mod multimodal cuing Lower Body Bathing: Moderate assistance   Upper Body Dressing : Minimal assistance Upper Body Dressing Details (indicate cue type and reason): to don clothing     Toilet Transfer: Minimal assistance   Toileting- Clothing Manipulation and Hygiene: Moderate assistance Toileting - Clothing Manipulation Details (indicate cue type and reason): clothing management hygiene     Functional mobility during ADLs: Minimal assistance       Vision Patient Visual Report: No change from baseline            Cognition Arousal/Alertness: Awake/alert Behavior During Therapy: WFL for tasks assessed/performed Overall Cognitive Status:  Within Functional Limits for tasks assessed Area of Impairment: Orientation;Attention;Following commands;Safety/judgement;Awareness;Problem solving;Memory                 Orientation Level: Disoriented to;Time;Situation;Place Current  Attention Level: Focused   Following Commands: Follows multi-step commands inconsistently;Follows multi-step commands with increased time Safety/Judgement: Decreased awareness of safety;Decreased awareness of deficits Awareness: Intellectual Problem Solving: Slow processing;Difficulty sequencing;Requires verbal cues;Requires tactile cues General Comments: Pt remains very confused and needing mod multimodal cuing for sequencing and initiation                   Pertinent Vitals/ Pain       Pain Assessment: Faces Faces Pain Scale: No hurt                                                          Frequency  Min 2X/week        Progress Toward Goals  OT Goals(current goals can now be found in the care plan section)  Progress towards OT goals: Progressing toward goals  Acute Rehab OT Goals Patient Stated Goal: to improve memory and mobility OT Goal Formulation: With patient/family Time For Goal Achievement: 02/13/21 Potential to Achieve Goals: Good  Plan Discharge plan remains appropriate                   AM-PAC OT "6 Clicks" Daily Activity     Outcome Measure   Help from another person eating meals?: None Help from another person taking care of personal grooming?: A Little Help from another person toileting, which includes using toliet, bedpan, or urinal?: A Little Help from another person bathing (including washing, rinsing, drying)?: A Little Help from another person to put on and taking off regular upper body clothing?: A Little Help from another person to put on and taking off regular lower body clothing?: A Little 6 Click Score: 19    End of Session    OT Visit Diagnosis: Unsteadiness on feet (R26.81);Repeated falls (R29.6);Muscle weakness (generalized) (M62.81)   Activity Tolerance Patient tolerated treatment well   Patient Left in bed;with call bell/phone within reach   Nurse Communication Mobility status        Time:  7867-5449 OT Time Calculation (min): 25 min  Charges: OT General Charges $OT Visit: 1 Visit OT Treatments $Self Care/Home Management : 23-37 mins  Darleen Crocker, MS, OTR/L , CBIS ascom [phone]  02/04/21, 4:08 PM

## 2021-02-04 NOTE — Progress Notes (Signed)
Taylor Hogan   DOB:June 28, 1930   PZ#:[id number]    Subjective: Patient more awake; alert still confused.  However he has been more mobile/physically active-as per the report from the sitter.  Objective:  Vitals:   02/04/21 1550 02/04/21 2225  BP: 125/71 111/63  Pulse: 99 88  Resp: 18 18  Temp: 98.1 F (36.7 C) 98.2 F (36.8 C)  SpO2: 100% 99%     Intake/Output Summary (Last 24 hours) at 02/04/2021 2246 Last data filed at 02/04/2021 1349 Gross per 24 hour  Intake 420 ml  Output 251 ml  Net 169 ml    Physical Exam Vitals and nursing note reviewed.  Constitutional:      Comments: Patient resting in the bed.  Awake but Confused.  HENT:     Head: Normocephalic and atraumatic.     Mouth/Throat:     Mouth: Mucous membranes are moist.     Pharynx: No oropharyngeal exudate.  Eyes:     Pupils: Pupils are equal, round, and reactive to light.  Cardiovascular:     Rate and Rhythm: Normal rate and regular rhythm.  Pulmonary:     Effort: No respiratory distress.     Breath sounds: No wheezing.     Comments: Decreased breath sounds bilaterally at bases.  No wheeze or crackles Abdominal:     General: Bowel sounds are normal. There is no distension.     Palpations: Abdomen is soft. There is no mass.     Tenderness: There is no abdominal tenderness. There is no guarding or rebound.  Musculoskeletal:        General: No tenderness. Normal range of motion.     Cervical back: Normal range of motion and neck supple.  Skin:    General: Skin is warm.  Neurological:     General: No focal deficit present.     Mental Status: He is alert.  Psychiatric:        Mood and Affect: Affect normal.        Judgment: Judgment normal.     Labs:  Lab Results  Component Value Date   WBC 6.5 02/04/2021   HGB 9.2 (L) 02/04/2021   HCT 27.2 (L) 02/04/2021   MCV 94.1 02/04/2021   PLT 97 (L) 02/04/2021   NEUTROABS 2.6 01/17/2021    Lab Results  Component Value Date   NA 140 02/04/2021   K 5.0  02/04/2021   CL 113 (H) 02/04/2021   CO2 21 (L) 02/04/2021    Studies:  No results found.  Prostate cancer metastatic to multiple sites [facility]) #87 year old male patient newly diagnosed metastatic prostate cancer; is currently admitted to hospital for acute delirium/mental status changes  # Metastatic prostate cancer to the bone/bone marrow-stage IV; castrate sensitive.  Patient s/p [place] therapy/androgen deprivation therapy]; PSA improved-a 27; baseline 55 in June 2022.  I reviewed the improvement of the PSA with the patient's son.  #Pancytopenia-severe anemia; secondary to bone marrow infiltration with prostate cancer.  S/p PRBC transfusion hemoglobin today is 9.2.  Stable.  #Mental status changes: Acute delirium-CT scan acute small on chronic subdural hematoma-question s/p fall.  However still confused-secondary to delirium.  #Chronic kidney disease-stage III clinically stable  #Await evaluation with Taylor Hogan tomorrow/palliative care tomorrow-regarding the next management options.  I again today called and spoke to patient's son Taylor Hogan.  We will plan meeting with [facility] tomorrow.   Cammie Sickle, MD 02/04/2021  10:46 PM

## 2021-02-05 DIAGNOSIS — G9341 Metabolic encephalopathy: Secondary | ICD-10-CM | POA: Diagnosis not present

## 2021-02-05 DIAGNOSIS — Z515 Encounter for palliative care: Secondary | ICD-10-CM | POA: Diagnosis not present

## 2021-02-05 LAB — CBC
HCT: 25.6 % — ABNORMAL LOW (ref 39.0–52.0)
Hemoglobin: 9.3 g/dL — ABNORMAL LOW (ref 13.0–17.0)
MCH: 35 pg — ABNORMAL HIGH (ref 26.0–34.0)
MCHC: 36.3 g/dL — ABNORMAL HIGH (ref 30.0–36.0)
MCV: 96.2 fL (ref 80.0–100.0)
Platelets: 92 10*3/uL — ABNORMAL LOW (ref 150–400)
RBC: 2.66 MIL/uL — ABNORMAL LOW (ref 4.22–5.81)
RDW: 18.6 % — ABNORMAL HIGH (ref 11.5–15.5)
WBC: 6.1 10*3/uL (ref 4.0–10.5)
nRBC: 1.5 % — ABNORMAL HIGH (ref 0.0–0.2)

## 2021-02-05 LAB — BASIC METABOLIC PANEL
Anion gap: 5 (ref 5–15)
BUN: 53 mg/dL — ABNORMAL HIGH (ref 8–23)
CO2: 20 mmol/L — ABNORMAL LOW (ref 22–32)
Calcium: 8.1 mg/dL — ABNORMAL LOW (ref 8.9–10.3)
Chloride: 115 mmol/L — ABNORMAL HIGH (ref 98–111)
Creatinine, Ser: 1.33 mg/dL — ABNORMAL HIGH (ref 0.61–1.24)
GFR, Estimated: 51 mL/min — ABNORMAL LOW (ref >60)
Glucose, Bld: 95 mg/dL (ref 70–99)
Potassium: 4.8 mmol/L (ref 3.5–5.1)
Sodium: 140 mmol/L (ref 135–145)

## 2021-02-05 MED ORDER — QUETIAPINE FUMARATE 25 MG PO TABS: 25.0000 mg | ORAL_TABLET | Freq: Once | ORAL | Status: DC

## 2021-02-05 NOTE — Progress Notes (Signed)
PROGRESS NOTE   HPI was taken from Dr. Zada Finders:  Taylor Hogan is a 87 y.o. 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  History is supplemented by patient's son and POA at bedside.  Per son, patient has had several weeks of functional decline.  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  He has been started on chemotherapy with Mills Koller with last infusion on 01/22/2021.  Since then patient has been having diarrhea, fatigue, generalized weakness, and poor appetite.  It appears he has not been taking his medications regularly per son.  He has been having difficulty walking with imbalance, unclear if he has had any falls recently.  Per son he refuses to use a cane/walker for assistance.  He was recently started on prednisone 20 mg for poor appetite which seemed to have provided some benefit.   ED Course:  Initial vitals showed BP 106/59, pulse 78, RR 17, temp 97.5 F, SPO2 99% on room air.   Labs show WBC 10.9, hemoglobin 8.7, platelets 145,000, sodium 143, potassium 4.8, bicarb 19, BUN 119, creatinine 2.07 (baseline 1.5-1.6), serum glucose 109, high-sensitivity troponin 9, AST 25, ALT 27, alk phos 446, total bilirubin 0.8, CK20 6, lactic acid 2.0.   SARS-CoV-2 PCR negative.  Influenza A/B PCR's are negative.  Urinalysis negative for UTI.   CT abdomen/pelvis without contrast negative for acute abdominal pelvic findings.  No evidence of bowel obstruction.  Sigmoid diverticulosis without evidence of acute diverticulitis noted.  Extensive sclerosis of visualized osseous structures noted consistent with metastatic disease in setting of known prostate cancer.   CT head without contrast showed acute on chronic right holohemispheric collection  measuring up to 7 mm with hyperdense focus along the inferior aspect of collection representing small amount of acute blood products.  Minimal local mass-effect.  Encephalomalacia in the right posterior parietal lobe is seen and unchanged.  Chronic lacunar type infarct in the basal ganglia and cerebellum noted.   Portable chest x-ray negative for focal consolidation, edema, or effusion.  Extensive sclerosis of visualized osseous structures noted.   Patient was given 1.5 L normal saline.  He required oral Xanax and IV Haldol for agitation.  EDP discussed with on-call neurosurgery, Dr. Cari Caraway who recommended follow-up CT head in 6 hours.  The hospitalist service was consulted to admit for further evaluation and management.   Hospital course from Dr. Jimmye Norman 7/13-7/19/22:  Pt's mental status has waxed and waned throughout hospital stay, likely secondary to intracranial hemorrhage vs worsening dementia/cognitive impairment. Pt was found to have acute on chronic subdural hematoma for which neuro surg did not recommend any intervention. Of note, palliative care (Josh Borders) is suppose to meet w/ pt, pt's son today to discuss goals of care- SNF vs hospice vs home. The palliative care family meeting should happen sometime today.    Taylor Hogan  IDP:[id number] DOB: 1930-07-05 DOA: 01/29/2021 PCP: System, Provider Not In    Assessment & Plan:   Principal Problem:   Acute metabolic encephalopathy Active Problems:   Mild cognitive impairment   Prostate cancer metastatic to multiple sites [facility])   Acute kidney injury superimposed on CKD ([place])   Intracranial hematoma (HCC)   Intracranial hemorrhage (HCC)   Protein-calorie malnutrition, severe   Acute metabolic encephalopathy: mental status waxes and wanes. Likely  secondary intracranial hemorrhage vs worsening dementia/cognitive impairment. D/c IVFs.  Acute on chronic subdural hematoma: etiology unclear, possibly a fall at home. No surgical  intervention or f/u needed as per neuro surg. Neuro surg recs apprec  AKI on CKDIIIb: Cr is trending down from day prior. Will continue to monitor   Hyperkalemia: resolved   Thrombocytopenia: labile. Will continue to monitor   Macrocytic anemia: H&H are stable. S/p 1 unit of pRBCs transfused   Metastatic prostate cancer: w/ mets to bone. Therapy as per onco. Onco following and recs apprec   HTN: continue to hold home dose of ACE-I    Depression: severity unknown. Continue on home dose of sertraline   Mild cognitive impairment: continue w/ supportive care   DVT prophylaxis: SCDs Code Status: full Family Communication:  Disposition Plan: likely d/c to SNF   Level of care: Med-Surg  Status is: Inpatient  Remains inpatient appropriate because:Inpatient level of care appropriate due to severity of illness,  palliative care family meeting today to determine goals of care otherwise stable for d/c   Dispo: The patient is from: Home              Anticipated d/c is to: SNF vs home vs hospice               Patient currently: is medically stable for d/c   Difficult to place patient : unclear   Consultants:  Neuro surg  Onco  Palliative care    Procedures:   Antimicrobials:    Subjective: Pt c/o malaise    Objective: Vitals:   02/04/21 0726 02/04/21 1257 02/04/21 1550 02/04/21 2225  BP: 135/73 (!) 113/54 125/71 111/63  Pulse: 86 74 99 88  Resp: 16 16 18 18   Temp: 98.7 F (37.1 C) 98.2 F (36.8 C) 98.1 F (36.7 C) 98.2 F (36.8 C)  TempSrc: Oral  Oral Oral  SpO2: 99% 99% 100% 99%  Weight:      Height:        Intake/Output Summary (Last 24 hours) at 02/05/2021 0724 Last data filed at 02/04/2021 1349 Gross per 24 hour  Intake 0 ml  Output --  Net 0 ml   Filed Weights   01/29/21 1356 01/29/21 2313  Weight: 56 kg 52.4 kg    Examination:  General exam: Appears comfortable. Frail appearing  Respiratory system: clear breath sounds b/l  Cardiovascular  system: S1 & S2+. No rubs or clicks  Gastrointestinal system: Abd is soft, NT, ND & normal bowel sounds  Central nervous system: Awake and alert. Moves all extremities  Psychiatry: judgement and insight appear abnormal. Flat mood and affect    Data Reviewed: I have personally reviewed following labs and imaging studies  CBC: Recent Labs  Lab 02/01/21 0205 02/02/21 0430 02/02/21 1947 02/03/21 0426 02/04/21 0730 02/05/21 0438  WBC 9.5 7.4  --  8.1 6.5 6.1  HGB 7.6* 8.1* 9.9* 9.2* 9.2* 9.3*  HCT 23.3* 27.0* 30.7* 27.5* 27.2* 25.6*  MCV 97.9 99.4  --  92.0 94.1 96.2  PLT 119* 109*  --  103* 97* 92*   Basic Metabolic Panel: Recent Labs  Lab 02/01/21 0205 02/01/21 1806 02/02/21 0430 02/03/21 0426 02/04/21 0730 02/05/21 0438  NA 141  --  143 143 140 140  K 5.4* 5.4* 5.1 5.0 5.0 4.8  CL 119*  --  118* 118* 113* 115*  CO2 20*  --  20* 21* 21* 20*  GLUCOSE 124*  --  112* 96 96 95  BUN 60*  --  51* 51* 65* 53*  CREATININE 1.47*  --  1.39* 1.35* 1.40* 1.33*  CALCIUM 8.2*  --  8.1* 7.9* 8.2* 8.1*   GFR: Estimated Creatinine Clearance: 28.5 mL/min (A) (by C-G formula based on SCr of 1.33 mg/dL (H)). Liver Function Tests: Recent Labs  Lab 01/29/21 1516 01/30/21 0359  AST 25 25  ALT 27 21  ALKPHOS 446* 372*  BILITOT 0.8 0.8  PROT 7.2 5.6*  ALBUMIN 3.4* 2.7*   No results for input(s): LIPASE, AMYLASE in the last 168 hours. No results for input(s): AMMONIA in the last 168 hours. Coagulation Profile: No results for input(s): INR, PROTIME in the last 168 hours. Cardiac Enzymes: Recent Labs  Lab 01/29/21 1516  CKTOTAL 26*   BNP (last 3 results) No results for input(s): PROBNP in the last 8760 hours. HbA1C: No results for input(s): HGBA1C in the last 72 hours. CBG: No results for input(s): GLUCAP in the last 168 hours. Lipid Profile: No results for input(s): CHOL, HDL, LDLCALC, TRIG, CHOLHDL, LDLDIRECT in the last 72 hours. Thyroid Function Tests: No results for  input(s): TSH, T4TOTAL, FREET4, T3FREE, THYROIDAB in the last 72 hours. Anemia Panel: No results for input(s): VITAMINB12, FOLATE, FERRITIN, TIBC, IRON, RETICCTPCT in the last 72 hours. Sepsis Labs: Recent Labs  Lab 01/29/21 1516  LATICACIDVEN 2.0*    Recent Results (from the past 240 hour(s))  Resp Panel by RT-PCR (Flu A&B, Covid) Nasopharyngeal Swab     Status: None   Collection Time: 01/29/21  3:16 PM   Specimen: Nasopharyngeal Swab; Nasopharyngeal(NP) swabs in vial transport medium  Result Value Ref Range Status   SARS Coronavirus 2 by RT PCR NEGATIVE NEGATIVE Final    Comment: (NOTE) SARS-CoV-2 target nucleic acids are NOT DETECTED.  The SARS-CoV-2 RNA is generally detectable in upper respiratory specimens during the acute phase of infection. The lowest concentration of SARS-CoV-2 viral copies this assay can detect is 138 copies/mL. A negative result does not preclude SARS-Cov-2 infection and should not be used as the sole basis for treatment or other patient management decisions. A negative result may occur with  improper specimen collection/handling, submission of specimen other than nasopharyngeal swab, presence of viral mutation(s) within the areas targeted by this assay, and inadequate number of viral copies(<138 copies/mL). A negative result must be combined with clinical observations, patient history, and epidemiological information. The expected result is Negative.  Fact Sheet for Patients:  EntrepreneurPulse.com.au  Fact Sheet for Healthcare Providers:  IncredibleEmployment.be  This test is no t yet approved or cleared by the [place] FDA and  has been authorized for detection and/or diagnosis of SARS-CoV-2 by FDA under an Emergency Use Authorization (EUA). This EUA will remain  in effect (meaning this test can be used) for the duration of the COVID-19 declaration under Section 564(b)(1) of the Act, 21 U.S.C.section  360bbb-3(b)(1), unless the authorization is terminated  or revoked sooner.       Influenza A by PCR NEGATIVE NEGATIVE Final   Influenza B by PCR NEGATIVE NEGATIVE Final    Comment: (NOTE) The Xpert Xpress SARS-CoV-2/FLU/RSV plus assay is intended as an aid in the diagnosis of influenza from Nasopharyngeal swab specimens and should not be used as a sole basis for treatment. Nasal washings and aspirates are unacceptable for Xpert Xpress SARS-CoV-2/FLU/RSV testing.  Fact Sheet for Patients: EntrepreneurPulse.com.au  Fact Sheet for Healthcare Providers: IncredibleEmployment.be  This test is not yet approved or cleared by the [place] FDA and has  been authorized for detection and/or diagnosis of SARS-CoV-2 by FDA under an Emergency Use Authorization (EUA). This EUA will remain in effect (meaning this test can be used) for the duration of the COVID-19 declaration under Section 564(b)(1) of the Act, 21 U.S.C. section 360bbb-3(b)(1), unless the authorization is terminated or revoked.  Performed at [facility], [address], [place], [place] [address]          Radiology Studies: No results found.      Scheduled Meds:  acetaminophen-codeine  1 tablet Oral Once   feeding supplement  237 mL Oral TID BM   finasteride  5 mg Oral Daily   megestrol  40 mg Oral Daily   multivitamin with minerals  1 tablet Oral Daily   Continuous Infusions:     LOS: 6 days    Time spent: 15 mins     Wyvonnia Dusky, MD [facility] Pager [phone]  If 7PM-7AM, please contact night-coverage 02/05/2021, 7:24 AM

## 2021-02-05 NOTE — Progress Notes (Signed)
Nutrition Follow-up  DOCUMENTATION CODES:  Severe malnutrition in context of acute illness/injury  INTERVENTION:  Continue current diet as ordered Ensure Enlive po TID, each supplement provides 350 kcal and 20 grams of protein Magic cup TID with meals, each supplement provides 290 kcal and 9 grams of protein MVI with minerals daily Snacks TID between meals.  NUTRITION DIAGNOSIS:  Severe Malnutrition (in the context of acute illness) related to decreased appetite as evidenced by moderate muscle depletion, moderate fat depletion, percent weight loss (5.8% x 1 month).  GOAL:  Patient will meet greater than or equal to 90% of their needs  MONITOR:  PO intake, Supplement acceptance, Weight trends  REASON FOR ASSESSMENT:  Malnutrition Screening Tool    ASSESSMENT:  87 y.o. male with medical history significant for prostate cancer with mets (active chemo, last firmagon infusion 7/5), CKDIII, hx of CVA, pancytopenia, depression/anxiety, and mild cognitive impairment presented to the ED for AMS and decline at home.    Pt's son reports mets were recently detected to the bowel and started chemo. Since that time, pt has been having diarrhea, fatigue, generalized weakness, and poor appetite. Being followed by oncology center RD, recently started on appetite stimulant and is drinking 1-2 nutrition supplements each day per documentation.   Pt resting in bed, confused and unable to answer nutrition questions. NT at bedside states pt did not sleep last night and did not eat breakfast this AM due to lethargy. Poor intake of meals noted over the last week, but pt is routinely accepting nutrition supplements. Per Oncology, pt to meet with palliative care today. Snacks at bedside noted, NT states family has been bringing in items that he likes, will order snacks between meals.   Average Meal Intake: 7/13-7/19: 23% intake x 9 recorded meals  Nutritionally Relevant Medications: Scheduled Meds:  feeding  supplement  237 mL Oral TID BM   megestrol  40 mg Oral Daily   multivitamin with minerals  1 tablet Oral Daily   PRN Meds: loperamide, ondansetronl  Labs Reviewed: BUN 103, creatinine 1.7  NUTRITION - FOCUSED PHYSICAL EXAM: Flowsheet Row Most Recent Value  Orbital Region Moderate depletion  Upper Arm Region Mild depletion  Thoracic and Lumbar Region Moderate depletion  Buccal Region Moderate depletion  Temple Region Moderate depletion  Clavicle Bone Region Mild depletion  Clavicle and Acromion Bone Region Mild depletion  Scapular Bone Region Mild depletion  Dorsal Hand Moderate depletion  Patellar Region Severe depletion  Anterior Thigh Region Severe depletion  Posterior Calf Region Severe depletion  Edema (RD Assessment) None  Hair Reviewed  Eyes Reviewed  Mouth Reviewed  Skin Reviewed  Nails Reviewed   Diet Order:   Diet Order             Diet regular Room service appropriate? Yes; Fluid consistency: Thin  Diet effective now                  EDUCATION NEEDS:  Not appropriate for education at this time  Skin:  Skin Assessment: Reviewed RN Assessment  Last BM:  7/17 - type 4  Height:  Ht Readings from Last 1 Encounters:  01/29/21 5' 7.01" (1.702 m)    Weight:  Wt Readings from Last 1 Encounters:  01/29/21 52.4 kg    Ideal Body Weight:  67.3 kg  BMI:  Body mass index is 18.09 kg/m.  Estimated Nutritional Needs:  Kcal:  1600-1800 kcal/d Protein:  85-100g/d Fluid:  1.8-2 L/d   Ranell Patrick, RD, LDN  Clinical Dietitian Pager on [place]

## 2021-02-05 NOTE — TOC Progression Note (Signed)
Transition of Care [facility]) - Progression Note    Patient Details  Name: WRIGLEY PLASENCIA MRN: [id number] Date of Birth: 08/22/1929  [facility] of [facility]) CM/SW Contact  Kerin Salen, RN Phone Number: 02/05/2021, 2:50 PM  Clinical Narrative:  Spoke with Son about discharge SNF acceptances, Son prefers to discharge patient home with private duty nurses. Son states he will speak with Doctor today.  Received message from Attending that Son prefers to discharge patient home with Hospice, referral submitted.     Expected Discharge Plan: [place] Barriers to Discharge: Continued Medical Work up  Expected Discharge Plan and Services Expected Discharge Plan: [place]   Discharge Planning Services: CM Consult Post Acute Care Choice: [place] Living arrangements for the past 2 months: Single Family Home Expected Discharge Date: 02/04/21               DME Arranged: N/A DME Agency: NA       HH Arranged: NA           Social Determinants of Health (SDOH) Interventions    Readmission Risk Interventions No flowsheet data found.

## 2021-02-05 NOTE — Progress Notes (Signed)
Occupational Therapy Treatment Patient Details Name: Taylor Hogan MRN: [id number] DOB: Oct 01, 1929 Today's Date: 02/05/2021    History of present illness Pt is an 87 y.o. male admitted to hospital 7/12 with decreased p.o. intake, weakness, and increased agitation at home.  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  PMH includes anxiety, skin CA, prostate CA on chemotherapy, metastatic disease to bone, CKD, seizure like activity, and h/o stroke.   OT comments  Mr. Granada was seen for OT tx session this date. He is received semi-supine in bed with NT at bedside as Air cabin crew. Pt does not initially rouse to verbal/tactile stimulation (sternal rub). NT at bedside states pt had just been up with staff to use the restroom and had recently gone back to bed. NT also attempts to rouse pt unsuccessfully. This author leaves room to get Dinamap and alert pt primary RN. Upon return to room, pt is attempting to get out of bed with NT attempting to re-direct. Primary RN in room and states pt is stable and did not sleep much the previous night. Clears pt to participate in OT session.   When this Pryor Curia asks pt where he is trying to go, pt states "to get the hell away from you". Pt declines functional activity this date stating he "just wants to get out of this bed". Therapist facilitates functional mobility in room with consistent MIN A-CGA t/o for stability and multimodal cueing for sequencing/safety. Pt returns to bed with MOD A. Declines further activity. Pt vitals assessed: BP 11/60 (75); SO2 96% with pt on RA during session. Pt left with bed rails up, safety sitter present.   Pt is progressing toward OT goals and continues to benefit from skilled OT  services to maximize return to PLOF and minimize risk of future falls, injury, caregiver burden, and readmission. Will continue to follow POC as written. Discharge recommendation remains appropriate.     Follow Up Recommendations  SNF;Supervision/Assistance - 24 hour    Equipment Recommendations  Other (comment) (Defer)    Recommendations for Other Services      Precautions / Restrictions Precautions Precautions: Fall Precaution Comments: Metastatic disease to bones; small R SDH Restrictions Weight Bearing Restrictions: No       Mobility Bed Mobility Overal bed mobility: Needs Assistance Bed Mobility: Supine to Sit     Supine to sit: Mod assist Sit to supine: Mod assist   General bed mobility comments: cues to initiate movement    Transfers Overall transfer level: Needs assistance Equipment used: 1 person hand held assist;Rolling walker (2 wheeled) Transfers: Sit to/from Stand Sit to Stand: Min assist Stand pivot transfers: Min guard            Balance Overall balance assessment: Needs assistance Sitting-balance support: No upper extremity supported;Feet supported Sitting balance-Leahy Scale: Good Sitting balance - Comments: steady sitting reaching within BOS   Standing balance support: Bilateral upper extremity supported;During functional activity Standing balance-Leahy Scale: Poor Standing balance comment: CGA to min assist with turns (during walking using RW) Tends to lift RW rather than push forward.                           ADL either performed or assessed with clinical judgement   ADL Overall ADL's : Needs assistance/impaired  Functional mobility during ADLs: Minimal assistance;Min guard General ADL Comments: Pt remains generally confused t/o session. He declines to participate in functional tasks, but gets OOB to engage in functional mobility. He requires MOD A for bed mobility (primarily  to advance hips toward EOB); Min A/CGA for stability during functional mobility in room. Pt educated on safe use of RW/transfer techniques this date with limtied carryover. NT in room, also educated on safe transfer techniques.     Vision Patient Visual Report: No change from baseline     Perception     Praxis      Cognition Arousal/Alertness: Awake/alert;Lethargic Behavior During Therapy: Agitated;Restless Overall Cognitive Status: No family/caregiver present to determine baseline cognitive functioning Area of Impairment: Orientation;Attention;Following commands;Safety/judgement;Awareness;Problem solving;Memory                 Orientation Level: Disoriented to;Time;Situation;Place Current Attention Level: Focused Memory: Decreased short-term memory;Decreased recall of precautions (safety precautions) Following Commands: Follows one step commands with increased time;Follows one step commands inconsistently Safety/Judgement: Decreased awareness of safety;Decreased awareness of deficits Awareness: Intellectual Problem Solving: Slow processing;Difficulty sequencing;Requires verbal cues;Requires tactile cues General Comments: Pt cognition variable during session he remains generally confused and at times agitated. He tells this author on numerous occasions "I want to get the hell away from you". He needs mod multimodal cuing for sequencing and initiation of tasks during session.        Exercises Other Exercises Other Exercises: Pt educated on falls prevention strategies, safe use of AE for functional mobility, and safe transfer techniques. OT facilitates bed/functional mobility during session. Time to monitor vitals.   Shoulder Instructions       General Comments VSS during session. BP noted to be 111/60 (75), SO2 96% on RA.    Pertinent Vitals/ Pain       Faces Pain Scale: Hurts a little bit Pain Location: "hurts all over", also mentions pain in feet during session. Pain  Descriptors / Indicators: Aching;Sore;Discomfort Pain Intervention(s): Limited activity within patient's tolerance;Monitored during session;Repositioned  Home Living                                          Prior Functioning/Environment              Frequency  Min 2X/week        Progress Toward Goals  OT Goals(current goals can now be found in the care plan section)  Progress towards OT goals: Progressing toward goals  Acute Rehab OT Goals Patient Stated Goal: to improve memory and mobility OT Goal Formulation: With patient/family Time For Goal Achievement: 02/13/21 Potential to Achieve Goals: Good  Plan Discharge plan remains appropriate    Co-evaluation                 AM-PAC OT "6 Clicks" Daily Activity     Outcome Measure   Help from another person eating meals?: A Little Help from another person taking care of personal grooming?: A Little Help from another person toileting, which includes using toliet, bedpan, or urinal?: A Little Help from another person bathing (including washing, rinsing, drying)?: A Lot Help from another person to put on and taking off regular upper body clothing?: A Little Help from another person to put on and taking off regular lower body clothing?: A Lot 6 Click Score: 16    End of Session Equipment Utilized During Treatment: Gait belt;Rolling  walker  OT Visit Diagnosis: Unsteadiness on feet (R26.81);Repeated falls (R29.6);Muscle weakness (generalized) (M62.81)   Activity Tolerance Patient tolerated treatment well   Patient Left in bed;with call bell/phone within reach   Nurse Communication Mobility status        Time: 1341-1405 OT Time Calculation (min): 24 min  Charges: OT General Charges $OT Visit: 1 Visit OT Treatments $Self Care/Home Management : 8-22 mins $Therapeutic Activity: 8-22 mins  Shara Blazing, M.S., OTR/L Ascom: [phone] 02/05/21, 3:13 PM

## 2021-02-05 NOTE — Progress Notes (Signed)
Taylor Hogan   DOB:1930-04-08   IR#:[id number]    Subjective: Patient more awake; alert still confused.    Objective:  Vitals:   02/05/21 1151 02/05/21 1401  BP: 132/81 111/60  Pulse: 93 70  Resp: 18   Temp:    SpO2: 97%      Intake/Output Summary (Last 24 hours) at 02/05/2021 2233 Last data filed at 02/05/2021 1848 Gross per 24 hour  Intake 0 ml  Output 100 ml  Net -100 ml    Physical Exam Vitals and nursing note reviewed.  Constitutional:      Comments: Patient resting in the bed.  Awake but Confused.  HENT:     Head: Normocephalic and atraumatic.     Mouth/Throat:     Mouth: Mucous membranes are moist.     Pharynx: No oropharyngeal exudate.  Eyes:     Pupils: Pupils are equal, round, and reactive to light.  Cardiovascular:     Rate and Rhythm: Normal rate and regular rhythm.  Pulmonary:     Effort: No respiratory distress.     Breath sounds: No wheezing.     Comments: Decreased breath sounds bilaterally at bases.  No wheeze or crackles Abdominal:     General: Bowel sounds are normal. There is no distension.     Palpations: Abdomen is soft. There is no mass.     Tenderness: There is no abdominal tenderness. There is no guarding or rebound.  Musculoskeletal:        General: No tenderness. Normal range of motion.     Cervical back: Normal range of motion and neck supple.  Skin:    General: Skin is warm.  Neurological:     General: No focal deficit present.     Mental Status: He is alert.  Psychiatric:        Mood and Affect: Affect normal.        Judgment: Judgment normal.     Labs:  Lab Results  Component Value Date   WBC 6.1 02/05/2021   HGB 9.3 (L) 02/05/2021   HCT 25.6 (L) 02/05/2021   MCV 96.2 02/05/2021   PLT 92 (L) 02/05/2021   NEUTROABS 2.6 01/17/2021    Lab Results  Component Value Date   NA 140 02/05/2021   K 4.8 02/05/2021   CL 115 (H) 02/05/2021   CO2 20 (L) 02/05/2021    Studies:  No results found.  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  Patient s/p [place] therapy/androgen deprivation therapy]; PSA improved-a 27; baseline 55 in June 2022.  See discussion below  #Pancytopenia-severe anemia; secondary to bone marrow infiltration with prostate cancer.  S/p PRBC transfusion-clinically stable  #Mental status changes: Acute delirium-CT scan acute small on chronic subdural hematoma-question s/p fall.  Still continues/delirium.  #Discussed with Josh Borders at length.  As per the discussion with family/son-plan is to discharge patient home with hospice.  I think is very reasonable especially given the significant decline in the last few weeks.  I left a message for the patient's son [name]-again reviewed my recommendations/and answering questions.   Cammie Sickle, MD 02/05/2021  10:33 PM

## 2021-02-05 NOTE — Consult Note (Signed)
[place]  [phone] [phone]   Name: Taylor Hogan Date: 02/05/2021 MRN: [id number]  DOB: 1929/10/19  Patient Care Team: System, Provider Not In as PCP - General    REASON FOR CONSULTATION: Taylor Hogan is a 87 y.o. male with multiple medical problems including prostate cancer metastatic to bone on active chemotherapy, CKD stage III, history of CVA, pancytopenia, depression and anxiety, and mild cognitive impairment, who was admitted to the hospital on 01/29/2021 with altered mental status.  Patient was found to have a subdural hematoma on CT, which has been conservatively managed.  His altered mental status has waxed and waned during the hospitalization.  Palliative care is consulted to help address goals.  SOCIAL HISTORY:     reports that he has quit smoking. He has quit using smokeless tobacco.  His smokeless tobacco use included snuff. He reports that he does not drink alcohol and does not use drugs.  Patient is married lives at home with his wife.  He has a son who lives about 30 minutes away and another son in [place].  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  START FIRMAGON.  #Pancytopenia-severe anemia; s/p bone marrow biopsy [June 2022-] secondary to bone marrow infiltration with prostate cancer.  No obvious presence of any MDS/leukemia.       Prostate cancer metastatic to multiple sites [facility])  01/17/2021 Initial Diagnosis   Prostate cancer metastatic to  multiple sites [facility])     ALLERGIES:  is allergic to amoxicillin, lansoprazole, and sulfa antibiotics.  MEDICATIONS:  Current Facility-Administered Medications  Medication Dose Route Frequency Provider Last Rate Last Admin   acetaminophen (TYLENOL) tablet 650 mg  650 mg Oral Q6H PRN Lenore Cordia, MD   650 mg at 02/04/21 0305   Or   acetaminophen (TYLENOL) suppository 650 mg  650 mg Rectal Q6H PRN Lenore Cordia, MD       acetaminophen-codeine (TYLENOL #3) 300-30 MG per tablet 1 tablet  1 tablet Oral Once Rise Patience, MD       acetaminophen-codeine 120-12 MG/5ML solution 5 mL  5 mL Oral Q8H PRN Wyvonnia Dusky, MD   5 mL at 02/05/21 0106   feeding supplement (ENSURE ENLIVE / ENSURE PLUS) liquid 237 mL  237 mL Oral TID BM Wyvonnia Dusky, MD   237 mL at 02/05/21 0919   finasteride (PROSCAR) tablet 5 mg  5 mg Oral Daily Wyvonnia Dusky, MD   5 mg at 02/05/21 0919   loperamide (IMODIUM) capsule 2 mg  2 mg Oral PRN Lenore Cordia, MD   2 mg at 01/31/21 1549   megestrol (MEGACE) tablet 40 mg  40 mg Oral Daily Wyvonnia Dusky, MD   40 mg at 02/04/21 0757   multivitamin with minerals tablet 1 tablet  1 tablet Oral Daily Wyvonnia Dusky, MD   1 tablet at 02/05/21 0919   ondansetron (ZOFRAN) tablet 4 mg  4 mg Oral Q6H PRN Lenore Cordia, MD       Or   ondansetron (ZOFRAN) injection 4 mg  4 mg Intravenous Q6H PRN Lenore Cordia, MD       polyvinyl alcohol (LIQUIFILM TEARS) 1.4 % ophthalmic solution 1 drop  1 drop Both Eyes PRN Wyvonnia Dusky, MD       QUEtiapine (SEROQUEL) tablet 25 mg  25 mg Oral Once Wyvonnia Dusky, MD        VITAL SIGNS: BP 111/60   Pulse 70   Temp 98 F (36.7 C) (Oral)   Resp 18   Ht 5' 7.01" (1.702 m)   Wt 115 lb 8.3 oz (52.4 kg)   SpO2 97%   BMI 18.09 kg/m  Filed Weights   01/29/21 1356 01/29/21 2313  Weight: 123 lb 7.3 oz (56 kg) 115 lb 8.3 oz (52.4 kg)    Estimated body mass index is 18.09 kg/m as calculated from the  following:   Height as of this encounter: 5' 7.01" (1.702 m).   Weight as of this encounter: 115 lb 8.3 oz (52.4 kg).  LABS: CBC:    Component Value Date/Time   WBC 6.1 02/05/2021 0438   HGB 9.3 (L) 02/05/2021 0438   HGB 15.1 04/13/2013 0102   HCT 25.6 (L) 02/05/2021 0438   HCT 43.5 04/13/2013 0102   PLT 92 (L) 02/05/2021 0438   PLT 175 04/13/2013 0102   MCV 96.2 02/05/2021 0438   MCV 90 04/13/2013 0102   NEUTROABS 2.6 01/17/2021 0935   LYMPHSABS 1.5 01/17/2021 0935   MONOABS 0.5 01/17/2021 0935   EOSABS 0.1 01/17/2021 0935   BASOSABS 0.1 01/17/2021 0935   Comprehensive Metabolic Panel:    Component Value Date/Time   NA 140 02/05/2021 0438   NA 141 04/13/2013 0102   K 4.8 02/05/2021 0438   K 3.8 04/13/2013 0102   CL 115 (H) 02/05/2021 0438   CL 107 04/13/2013 0102   CO2 20 (L) 02/05/2021 0438   CO2 27 04/13/2013 0102   BUN 53 (H) 02/05/2021 0438   BUN 15 04/13/2013 0102   CREATININE 1.33 (H) 02/05/2021 0438   CREATININE 1.60 (H) 04/13/2013 0102   GLUCOSE 95 02/05/2021 0438   GLUCOSE 97 04/13/2013 0102   CALCIUM 8.1 (L) 02/05/2021 0438   CALCIUM 8.9 04/13/2013 0102   AST 25 01/30/2021 0359   ALT 21 01/30/2021 0359   ALKPHOS 372 (H) 01/30/2021 0359   BILITOT 0.8 01/30/2021 0359   PROT 5.6 (L) 01/30/2021 0359   ALBUMIN 2.7 (L) 01/30/2021 0359    RADIOGRAPHIC STUDIES: CT ABDOMEN PELVIS WO CONTRAST  Result Date: 01/29/2021 CLINICAL DATA:  Weakness, decreased p.o. intake. No bowel movement for 1 week. History of prostate cancer EXAM: CT ABDOMEN AND PELVIS WITHOUT CONTRAST TECHNIQUE: Multidetector CT imaging of the abdomen and pelvis was performed following the standard protocol without IV contrast. COMPARISON:  01/03/2021 FINDINGS: Lower chest: Included lung bases are clear.  Heart size is normal. Hepatobiliary: Stable size and appearance of multiple low-density lesions within the liver, largest of which is in the anterior left hepatic lobe measuring approximately 4.0  cm with internal fluid density compatible with a simple cyst. Unremarkable gallbladder. No hyperdense gallstone. No biliary dilatation. Pancreas: Unremarkable. No pancreatic ductal dilatation or surrounding inflammatory changes. Spleen: Normal in size without focal abnormality. Adrenals/Urinary Tract: Unremarkable adrenal glands. Tiny vascular calcifications versus nonobstructing calculi within each kidney are unchanged. No hydronephrosis. Urinary bladder appears within normal limits. Stomach/Bowel:  Small hiatal hernia. Stomach otherwise within normal limits. No dilated loops bowel to suggest obstruction. Sigmoid diverticulosis. No focal bowel wall thickening or inflammatory changes. Vascular/Lymphatic: Aortoiliac atherosclerosis without aneurysm. Circumaortic left renal vein, an anatomic variant. No abdominopelvic lymphadenopathy. Reproductive: Mildly enlarged prostate gland. Other: No free fluid. No abdominopelvic fluid collection. No pneumoperitoneum. No abdominal wall hernia. Musculoskeletal: Markedly abnormal appearance of the visualized osseous structures with extensive sclerosis most suggestive of diffuse osseous metastatic disease in the setting of known prostate cancer. No pathologic fracture identified. Degenerative disc disease of the lumbar spine. IMPRESSION: 1. No acute abdominopelvic findings. No evidence of bowel obstruction. 2. Sigmoid diverticulosis without evidence of acute diverticulitis. 3. Redemonstrated markedly abnormal appearance of the visualized osseous structures with extensive sclerosis most suggestive of diffuse osseous metastatic disease in the setting of known prostate cancer. No pathologic fracture identified. Aortic Atherosclerosis (ICD10-I70.0). Electronically Signed   By: Davina Poke D.O.   On: 01/29/2021 16:07   CT Head Wo Contrast  Result Date: 01/29/2021 CLINICAL DATA:  Weakness EXAM: CT HEAD WITHOUT CONTRAST TECHNIQUE: Contiguous axial images were obtained from the  base of the skull through the vertex without intravenous contrast. COMPARISON:  Head CT 01/29/2021 FINDINGS: Brain: Slightly decreased thickness right convexity subdural hematoma, subacute on chronic. No midline shift or other mass effect. Diffuse white matter hypoattenuation and old right posterior MCA territory infarct. Generalized volume loss. Vascular: No hyperdense vessel or unexpected calcification. Skull: Normal. Negative for fracture or focal lesion. Sinuses/Orbits: No acute finding. Other: None IMPRESSION: 1. Slightly decreased thickness of right convexity subdural hematoma, subacute on chronic. No midline shift or other mass effect. 2. 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 Electronically Signed   By: Ulyses Jarred M.D.   On: 01/29/2021 22:44   CT Head Wo Contrast  Result Date: 01/29/2021 CLINICAL DATA:  Altered mental status, weakness EXAM: CT HEAD WITHOUT CONTRAST TECHNIQUE: Contiguous axial images were obtained from the base of the skull through the vertex without intravenous contrast. COMPARISON:  MRI brain February 23, 2018 and head CT February 18, 2010 FINDINGS: Brain: Mixed density right whole hemispheric collection measuring up to 7 mm on coronal image 24. There is a hyperdense focus along the inferior aspect of the collection also seen on image 24 of the coronal sequence. Mild effacement of the parenchyma adjacent to the collection without other findings of mass effect. Age related global parenchymal volume loss with ex vacuo dilatation of the ventricular system. Similar burden of moderate to severe chronic ischemic small vessel white matter disease. Encephalomalacia in the right posterior parietal lobe appears unchanged. Chronic lacunar type infarcts in the basal ganglia and cerebellum. No evidence of acute large vascular territory infarction, parenchymal hemorrhage, hydrocephalus, or mass lesion. Vascular: No hyperdense vessel. Atherosclerotic  calcifications of the internal carotid arteries at the skull base. Skull: Normal. Negative for fracture or focal lesion. Sinuses/Orbits: Visualized portions of the paranasal sinuses and ethmoid air cells are predominantly clear. Prior bilateral lens surgery. Other: None IMPRESSION: 1. Acute on chronic right whole hemispheric collection measuring up to 7 mm. There is a hyperdense focus along the inferior aspect of the collection which likely represents a small amount of acute blood products. Minimal local mass effect. 2. Age related global parenchymal volume loss and moderate to severe chronic ischemic small vessel white matter disease. 3. Encephalomalacia in the right posterior parietal lobe appears unchanged. Chronic lacunar type infarcts in the basal ganglia and cerebellum. These results were called  by telephone at the time of interpretation on 01/29/2021 at 5:27 pm to provider Merlyn Lot , who verbally acknowledged these results. Electronically Signed   By: Dahlia Bailiff MD   On: 01/29/2021 17:28   DG Chest Portable 1 View  Result Date: 01/29/2021 CLINICAL DATA:  Weakness concern for infection. EXAM: PORTABLE CHEST 1 VIEW COMPARISON:  CT abdomen from same day. FINDINGS: The heart size and mediastinal contours are within normal limits. Both lungs are clear. Extensive sclerosis of the visualized osseous structures, suggestive of diffuse metastatic disease in this patient with known prostate cancer. IMPRESSION: 1. No acute cardiopulmonary disease. 2. Extensive sclerosis of the visualized osseous structures, suggestive of diffuse metastatic disease in this patient with known prostate cancer. Electronically Signed   By: Dahlia Bailiff MD   On: 01/29/2021 17:30   CT BONE MARROW BIOPSY & ASPIRATION  Result Date: 01/10/2021 CLINICAL DATA:  Anemia and thrombocytopenia. EXAM: CT GUIDED BONE MARROW ASPIRATION AND BIOPSY ANESTHESIA/SEDATION: Versed 1.0 mg IV, Fentanyl 50 mcg IV Total Moderate Sedation Time:    14 minutes. The patient's level of consciousness and physiologic status were continuously monitored during the procedure by Radiology nursing. PROCEDURE: The procedure risks, benefits, and alternatives were explained to the patient. Questions regarding the procedure were encouraged and answered. The patient understands and consents to the procedure. A time out was performed prior to initiating the procedure. The right gluteal region was prepped with chlorhexidine. Sterile gown and sterile gloves were used for the procedure. Local anesthesia was provided with 1% Lidocaine. Under CT guidance, an 11 gauge On Control bone cutting needle was advanced from a posterior approach into the right iliac bone. Needle positioning was confirmed with CT. Initial non heparinized and heparinized aspirate samples were obtained of bone marrow. Core biopsy was performed via the On Control drill needle. COMPLICATIONS: None FINDINGS: Initial imaging of the lower lumbar spine, sacrum and bony pelvis demonstrates diffuse abnormal sclerosis of all of the visualized bony structures. Findings are suggestive of either diffuse sclerotic metastatic disease or a diffuse bone marrow process. Given sclerosis, metastatic prostate carcinoma would be a concern. Initial aspiration yielded very limited return of liquified marrow. An intact core biopsy sample was able to be obtained. IMPRESSION: 1. CT guided bone marrow biopsy of right posterior iliac bone with both aspirate and core samples obtained. Aspirate sample was very limited with a nearly dry tap. 2. Diffuse abnormal sclerosis of all bony structures visualized including the lower lumbar spine, sacrum and bilateral iliac bones. Findings may be consistent with diffuse sclerotic metastatic disease or a diffuse bone marrow process. Metastatic prostate carcinoma would be of concern and a PSA level was drawn following the biopsy procedure. Electronically Signed   By: Aletta Edouard M.D.   On:  01/10/2021 15:28    PERFORMANCE STATUS (ECOG) : 3 - Symptomatic, >50% confined to bed  Review of Systems Unable to complete  Physical Exam General: NAD Pulmonary: Unlabored Extremities: no edema, no joint deformities Skin: no rashes Neurological: Weakness, confused, oriented only to person  IMPRESSION: Patient is currently confused and agitated at sitter.  Patient is only oriented to person and is unable to engage meaningfully in a conversation regarding goals.  I called and spoke with his son, Gerald Stabs, by phone.  Gerald Stabs recalls speaking with Dr. Rogue Bussing about options for future cancer treatment versus transitioning to focus more on comfort/hospice.  Gerald Stabs says that he would prefer patient to return home at time of discharge.  He is seen patient  declined fairly rapidly over the past several weeks and thinks that patient might continue to decline.  He says that family is not interested in pursuing further cancer treatment at the present time.  He was interested in hospice following at time of discharge.  We will consult the hospice liaison to coordinate services.  Discussed CODE STATUS.  Gerald Stabs says that he is talked with his mother and family would not want patient to be resuscitated or have his life prolonged artificially on machines.  Family is in agreement with DNR/DNI.  Symptomatically, patient has had periods of agitation.  It appears that Seroquel was ordered today.  Per nurse, patient has not been sleeping well at night.  Would consider giving the Seroquel at bedtime if possible.  PLAN: -Best supportive care -Home with hospice when medically ready -DNR/DNI -Agree with low-dose antipsychotic for management of agitated delirium  Case and plan discussed with Drs. Jimmye Norman and Sibley   Time Total: 60 minutes  Visit consisted of counseling and education dealing with the complex and emotionally intense issues of symptom management and palliative care in the setting of serious  and potentially life-threatening illness.Greater than 50%  of this time was spent counseling and coordinating care related to the above assessment and plan.  Signed by: Altha Harm, PhD, NP-C

## 2021-02-05 NOTE — Progress Notes (Signed)
[facility])  Referral received for hospice services at home.  Spoke with son Gerald Stabs, he endorses same. Provided support, answered questions.   No current DME needs. He has a cane and walker that he refuses to use and they would like to try to have him sleep in his own bed for now.  Gerald Stabs will pick his father up tomorrow once notified by the hospital that he is ready to be discharged.  If comfort meds are anticipated, please arrange with [facility] in [place] so there is no lapse in his comfort prior to hospice services beginning.  Thank you, Venia Carbon RN, BSN, [facility] Liaison

## 2021-02-05 NOTE — Progress Notes (Signed)
Patient very agitated with sitter. MD notified and medication order. Pt able to be redirected and eventually calmed down enough to fall asleep without medication.

## 2021-02-06 ENCOUNTER — Ambulatory Visit: Payer: Medicare Other | Admitting: Gastroenterology

## 2021-02-06 LAB — BASIC METABOLIC PANEL WITH GFR
Anion gap: 8 (ref 5–15)
BUN: 45 mg/dL — ABNORMAL HIGH (ref 8–23)
CO2: 22 mmol/L (ref 22–32)
Calcium: 8.4 mg/dL — ABNORMAL LOW (ref 8.9–10.3)
Chloride: 109 mmol/L (ref 98–111)
Creatinine, Ser: 1.32 mg/dL — ABNORMAL HIGH (ref 0.61–1.24)
GFR, Estimated: 52 mL/min — ABNORMAL LOW
Glucose, Bld: 95 mg/dL (ref 70–99)
Potassium: 4.7 mmol/L (ref 3.5–5.1)
Sodium: 139 mmol/L (ref 135–145)

## 2021-02-06 LAB — CBC
HCT: 28.1 % — ABNORMAL LOW (ref 39.0–52.0)
Hemoglobin: 9.3 g/dL — ABNORMAL LOW (ref 13.0–17.0)
MCH: 31.6 pg (ref 26.0–34.0)
MCHC: 33.1 g/dL (ref 30.0–36.0)
MCV: 95.6 fL (ref 80.0–100.0)
Platelets: 66 K/uL — ABNORMAL LOW (ref 150–400)
RBC: 2.94 MIL/uL — ABNORMAL LOW (ref 4.22–5.81)
RDW: 18.4 % — ABNORMAL HIGH (ref 11.5–15.5)
WBC: 5 K/uL (ref 4.0–10.5)
nRBC: 1.6 % — ABNORMAL HIGH (ref 0.0–0.2)

## 2021-02-06 MED ORDER — TRAZODONE HCL 50 MG PO TABS
25.0000 mg | ORAL_TABLET | Freq: Every evening | ORAL | Status: DC | PRN
  Administered 2021-02-06: 25 mg via ORAL
  Filled 2021-02-06: qty 1

## 2021-02-06 MED ORDER — HALOPERIDOL LACTATE 5 MG/ML IJ SOLN: 0.5000 mg | Freq: Four times a day (QID) | INTRAMUSCULAR | Status: DC | PRN

## 2021-02-06 MED ORDER — QUETIAPINE FUMARATE 50 MG PO TABS: 50.0000 mg | ORAL_TABLET | Freq: Every evening | ORAL | 0 refills | Status: DC

## 2021-02-06 NOTE — Care Management Important Message (Signed)
Important Message  Patient Details  Name: STOKELY JEANCHARLES MRN: [id number] Date of Birth: [birth year]   Medicare Important Message Given:  Other (see comment)  Patient is being discharged home with Hospice. Out of respect for the patient and family no Important Message from [facility] given.  Juliann Pulse A Ranette Luckadoo 02/06/2021, 7:33 AM

## 2021-02-06 NOTE — Discharge Summary (Signed)
Physician Discharge Summary  Taylor Hogan IAX:[id number] DOB: [birth year] DOA: 01/29/2021  PCP: System, Provider Not In  Admit date: 01/29/2021 Discharge date: 02/06/2021  Time spent: 35 minutes  Recommendations for Outpatient Follow-up:  Home with hospice services for symptom/comfort focused care   Discharge Diagnoses:  Principal Problem:   Acute metabolic encephalopathy Active Problems:   Mild cognitive impairment   Prostate cancer metastatic to multiple sites [facility])   Acute kidney injury superimposed on CKD ([place])   Intracranial hematoma ([facility])   Intracranial hemorrhage ([place])   Protein-calorie malnutrition, severe   Palliative care encounter   Discharge Condition: Guarded  Diet recommendation: Comfort feeds  Filed Weights   01/29/21 1356 01/29/21 2313  Weight: 56 kg 52.4 kg    History of present illness:  87 year old male with early dementia/cognitive decline, depression, anxiety, metastatic prostate cancer on chemotherapy, CVA was brought to the ED with cognitive decline in addition to ongoing functional decline, recently treated with chemotherapy, last infusion on 7/5 since then patient has been having diarrhea fatigue poor appetite and worsening confusion. -Work-up in the ED was notable for worsening creatinine, CT abdomen pelvis noted for worsening bony metastasis, CT head noted 7 mm subdural hematoma  Hospital Course:   Metabolic encephalopathy -Multifactorial in the background of cognitive impairment/dementia and worsened by subdural hematoma, recent chemo, dehydration etc. -Mental status continued to wax and wane -On account of advanced age, frailty, worsening metastatic disease and limited ability to tolerate chemotherapy, he was seen by palliative care this admission. -Family meeting completed, decision made to stop further treatment and pursue hospice services at home -Also seen by oncology this admission and agreed with above plan for hospice  Acute on chronic  subdural hematoma -Unclear etiology possible fall at home, case was discussed with neurosurgery, recommended nonoperative management  Anemia/thrombocytopenia -Transfused 1 unit of PRBC  Metastatic prostate cancer -Worsening bony mets -Recent chemotherapy with limited tolerance -See discussion palliative care as above, plan for discharge home with hospice services  AKI on CKD 3B -Creatinine improving with hydration and blood transfusion  Severe protein calorie malnutrition -Supplements added   Consultations: Oncology Palliative medicine  Discharge Exam: Vitals:   02/06/21 1245 02/06/21 1452  BP:  (!) 111/57  Pulse: 100 (!) 103  Resp:  16  Temp:  98.9 F (37.2 C)  SpO2:  100%    General: Awake alert, flat affect, cognitive deficits Cardiovascular: S1-S2, regular rate rhythm  respiratory: Decreased breath sounds to bases  Discharge Instructions   Discharge Instructions     Diet general   Complete by: As directed    Increase activity slowly   Complete by: As directed       Allergies as of 02/06/2021       Reactions   Amoxicillin Diarrhea   Lansoprazole Diarrhea   Sulfa Antibiotics Nausea And Vomiting        Medication List     STOP taking these medications    lisinopril 5 MG tablet Commonly known as: ZESTRIL   predniSONE 20 MG tablet Commonly known as: DELTASONE       TAKE these medications    acetaminophen-codeine 300-30 MG tablet Commonly known as: TYLENOL #3 Take 1 tablet by mouth every 8 (eight) hours as needed for moderate pain.   QUEtiapine 50 MG tablet Commonly known as: SEROquel Take 1 tablet (50 mg total) by mouth every evening. At [place] your doctor about these medications    sertraline 50 MG tablet  Commonly known as: ZOLOFT Take 1 tablet by mouth daily.       Allergies  Allergen Reactions   Amoxicillin Diarrhea   Lansoprazole Diarrhea   Sulfa Antibiotics Nausea And Vomiting      The results of  significant diagnostics from this hospitalization (including imaging, microbiology, ancillary and laboratory) are listed below for reference.    Significant Diagnostic Studies: CT ABDOMEN PELVIS WO CONTRAST  Result Date: 01/29/2021 CLINICAL DATA:  Weakness, decreased p.o. intake. No bowel movement for 1 week. History of prostate cancer EXAM: CT ABDOMEN AND PELVIS WITHOUT CONTRAST TECHNIQUE: Multidetector CT imaging of the abdomen and pelvis was performed following the standard protocol without IV contrast. COMPARISON:  01/03/2021 FINDINGS: Lower chest: Included lung bases are clear.  Heart size is normal. Hepatobiliary: Stable size and appearance of multiple low-density lesions within the liver, largest of which is in the anterior left hepatic lobe measuring approximately 4.0 cm with internal fluid density compatible with a simple cyst. Unremarkable gallbladder. No hyperdense gallstone. No biliary dilatation. Pancreas: Unremarkable. No pancreatic ductal dilatation or surrounding inflammatory changes. Spleen: Normal in size without focal abnormality. Adrenals/Urinary Tract: Unremarkable adrenal glands. Tiny vascular calcifications versus nonobstructing calculi within each kidney are unchanged. No hydronephrosis. Urinary bladder appears within normal limits. Stomach/Bowel: Small hiatal hernia. Stomach otherwise within normal limits. No dilated loops bowel to suggest obstruction. Sigmoid diverticulosis. No focal bowel wall thickening or inflammatory changes. Vascular/Lymphatic: Aortoiliac atherosclerosis without aneurysm. Circumaortic left renal vein, an anatomic variant. No abdominopelvic lymphadenopathy. Reproductive: Mildly enlarged prostate gland. Other: No free fluid. No abdominopelvic fluid collection. No pneumoperitoneum. No abdominal wall hernia. Musculoskeletal: Markedly abnormal appearance of the visualized osseous structures with extensive sclerosis most suggestive of diffuse osseous metastatic disease  in the setting of known prostate cancer. No pathologic fracture identified. Degenerative disc disease of the lumbar spine. IMPRESSION: 1. No acute abdominopelvic findings. No evidence of bowel obstruction. 2. Sigmoid diverticulosis without evidence of acute diverticulitis. 3. Redemonstrated markedly abnormal appearance of the visualized osseous structures with extensive sclerosis most suggestive of diffuse osseous metastatic disease in the setting of known prostate cancer. No pathologic fracture identified. Aortic Atherosclerosis (ICD10-I70.0). Electronically Signed   By: Davina Poke D.O.   On: 01/29/2021 16:07   CT Head Wo Contrast  Result Date: 01/29/2021 CLINICAL DATA:  Weakness EXAM: CT HEAD WITHOUT CONTRAST TECHNIQUE: Contiguous axial images were obtained from the base of the skull through the vertex without intravenous contrast. COMPARISON:  Head CT 01/29/2021 FINDINGS: Brain: Slightly decreased thickness right convexity subdural hematoma, subacute on chronic. No midline shift or other mass effect. Diffuse white matter hypoattenuation and old right posterior MCA territory infarct. Generalized volume loss. Vascular: No hyperdense vessel or unexpected calcification. Skull: Normal. Negative for fracture or focal lesion. Sinuses/Orbits: No acute finding. Other: None IMPRESSION: 1. Slightly decreased thickness of right convexity subdural hematoma, subacute on chronic. No midline shift or other mass effect. 2. 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 Electronically Signed   By: Ulyses Jarred M.D.   On: 01/29/2021 22:44   CT Head Wo Contrast  Result Date: 01/29/2021 CLINICAL DATA:  Altered mental status, weakness EXAM: CT HEAD WITHOUT CONTRAST TECHNIQUE: Contiguous axial images were obtained from the base of the skull through the vertex without intravenous contrast. COMPARISON:  MRI brain February 23, 2018 and head CT February 18, 2010 FINDINGS: Brain: Mixed  density right whole hemispheric collection measuring up to 7 mm on coronal image 24. There is a  hyperdense focus along the inferior aspect of the collection also seen on image 24 of the coronal sequence. Mild effacement of the parenchyma adjacent to the collection without other findings of mass effect. Age related global parenchymal volume loss with ex vacuo dilatation of the ventricular system. Similar burden of moderate to severe chronic ischemic small vessel white matter disease. Encephalomalacia in the right posterior parietal lobe appears unchanged. Chronic lacunar type infarcts in the basal ganglia and cerebellum. No evidence of acute large vascular territory infarction, parenchymal hemorrhage, hydrocephalus, or mass lesion. Vascular: No hyperdense vessel. Atherosclerotic calcifications of the internal carotid arteries at the skull base. Skull: Normal. Negative for fracture or focal lesion. Sinuses/Orbits: Visualized portions of the paranasal sinuses and ethmoid air cells are predominantly clear. Prior bilateral lens surgery. Other: None IMPRESSION: 1. Acute on chronic right whole hemispheric collection measuring up to 7 mm. There is a hyperdense focus along the inferior aspect of the collection which likely represents a small amount of acute blood products. Minimal local mass effect. 2. Age related global parenchymal volume loss and moderate to severe chronic ischemic small vessel white matter disease. 3. Encephalomalacia in the right posterior parietal lobe appears unchanged. Chronic lacunar type infarcts in the basal ganglia and cerebellum. These results were called by telephone at the time of interpretation on 01/29/2021 at 5:27 pm to provider Merlyn Lot , who verbally acknowledged these results. Electronically Signed   By: Dahlia Bailiff MD   On: 01/29/2021 17:28   DG Chest Portable 1 View  Result Date: 01/29/2021 CLINICAL DATA:  Weakness concern for infection. EXAM: PORTABLE CHEST 1 VIEW  COMPARISON:  CT abdomen from same day. FINDINGS: The heart size and mediastinal contours are within normal limits. Both lungs are clear. Extensive sclerosis of the visualized osseous structures, suggestive of diffuse metastatic disease in this patient with known prostate cancer. IMPRESSION: 1. No acute cardiopulmonary disease. 2. Extensive sclerosis of the visualized osseous structures, suggestive of diffuse metastatic disease in this patient with known prostate cancer. Electronically Signed   By: Dahlia Bailiff MD   On: 01/29/2021 17:30   CT BONE MARROW BIOPSY & ASPIRATION  Result Date: 01/10/2021 CLINICAL DATA:  Anemia and thrombocytopenia. EXAM: CT GUIDED BONE MARROW ASPIRATION AND BIOPSY ANESTHESIA/SEDATION: Versed 1.0 mg IV, Fentanyl 50 mcg IV Total Moderate Sedation Time:   14 minutes. The patient's level of consciousness and physiologic status were continuously monitored during the procedure by Radiology nursing. PROCEDURE: The procedure risks, benefits, and alternatives were explained to the patient. Questions regarding the procedure were encouraged and answered. The patient understands and consents to the procedure. A time out was performed prior to initiating the procedure. The right gluteal region was prepped with chlorhexidine. Sterile gown and sterile gloves were used for the procedure. Local anesthesia was provided with 1% Lidocaine. Under CT guidance, an 11 gauge On Control bone cutting needle was advanced from a posterior approach into the right iliac bone. Needle positioning was confirmed with CT. Initial non heparinized and heparinized aspirate samples were obtained of bone marrow. Core biopsy was performed via the On Control drill needle. COMPLICATIONS: None FINDINGS: Initial imaging of the lower lumbar spine, sacrum and bony pelvis demonstrates diffuse abnormal sclerosis of all of the visualized bony structures. Findings are suggestive of either diffuse sclerotic metastatic disease or a  diffuse bone marrow process. Given sclerosis, metastatic prostate carcinoma would be a concern. Initial aspiration yielded very limited return of liquified marrow. An intact core biopsy sample was able to be  obtained. IMPRESSION: 1. CT guided bone marrow biopsy of right posterior iliac bone with both aspirate and core samples obtained. Aspirate sample was very limited with a nearly dry tap. 2. Diffuse abnormal sclerosis of all bony structures visualized including the lower lumbar spine, sacrum and bilateral iliac bones. Findings may be consistent with diffuse sclerotic metastatic disease or a diffuse bone marrow process. Metastatic prostate carcinoma would be of concern and a PSA level was drawn following the biopsy procedure. Electronically Signed   By: Aletta Edouard M.D.   On: 01/10/2021 15:28    Microbiology: Recent Results (from the past 240 hour(s))  Resp Panel by RT-PCR (Flu A&B, Covid) Nasopharyngeal Swab     Status: None   Collection Time: 01/29/21  3:16 PM   Specimen: Nasopharyngeal Swab; Nasopharyngeal(NP) swabs in vial transport medium  Result Value Ref Range Status   SARS Coronavirus 2 by RT PCR NEGATIVE NEGATIVE Final    Comment: (NOTE) SARS-CoV-2 target nucleic acids are NOT DETECTED.  The SARS-CoV-2 RNA is generally detectable in upper respiratory specimens during the acute phase of infection. The lowest concentration of SARS-CoV-2 viral copies this assay can detect is 138 copies/mL. A negative result does not preclude SARS-Cov-2 infection and should not be used as the sole basis for treatment or other patient management decisions. A negative result may occur with  improper specimen collection/handling, submission of specimen other than nasopharyngeal swab, presence of viral mutation(s) within the areas targeted by this assay, and inadequate number of viral copies(<138 copies/mL). A negative result must be combined with clinical observations, patient history, and  epidemiological information. The expected result is Negative.  Fact Sheet for Patients:  EntrepreneurPulse.com.au  Fact Sheet for Healthcare Providers:  IncredibleEmployment.be  This test is no t yet approved or cleared by the [place] FDA and  has been authorized for detection and/or diagnosis of SARS-CoV-2 by FDA under an Emergency Use Authorization (EUA). This EUA will remain  in effect (meaning this test can be used) for the duration of the COVID-19 declaration under Section 564(b)(1) of the Act, 21 U.S.C.section 360bbb-3(b)(1), unless the authorization is terminated  or revoked sooner.       Influenza A by PCR NEGATIVE NEGATIVE Final   Influenza B by PCR NEGATIVE NEGATIVE Final    Comment: (NOTE) The Xpert Xpress SARS-CoV-2/FLU/RSV plus assay is intended as an aid in the diagnosis of influenza from Nasopharyngeal swab specimens and should not be used as a sole basis for treatment. Nasal washings and aspirates are unacceptable for Xpert Xpress SARS-CoV-2/FLU/RSV testing.  Fact Sheet for Patients: EntrepreneurPulse.com.au  Fact Sheet for Healthcare Providers: IncredibleEmployment.be  This test is not yet approved or cleared by the [place] FDA and has been authorized for detection and/or diagnosis of SARS-CoV-2 by FDA under an Emergency Use Authorization (EUA). This EUA will remain in effect (meaning this test can be used) for the duration of the COVID-19 declaration under Section 564(b)(1) of the Act, 21 U.S.C. section 360bbb-3(b)(1), unless the authorization is terminated or revoked.  Performed at [facility], [address] [place], [place] [address]      Labs: Basic Metabolic Panel: Recent Labs  Lab 02/02/21 0430 02/03/21 0426 02/04/21 0730 02/05/21 0438 02/06/21 0455  NA 143 143 140 140 139  K 5.1 5.0 5.0 4.8 4.7  CL 118* 118* 113* 115* 109  CO2 20* 21* 21*  20* 22  GLUCOSE 112* 96 96 95 95  BUN 51* 51* 65* 53* 45*  CREATININE 1.39* 1.35*  1.40* 1.33* 1.32*  CALCIUM 8.1* 7.9* 8.2* 8.1* 8.4*   Liver Function Tests: No results for input(s): AST, ALT, ALKPHOS, BILITOT, PROT, ALBUMIN in the last 168 hours. No results for input(s): LIPASE, AMYLASE in the last 168 hours. No results for input(s): AMMONIA in the last 168 hours. CBC: Recent Labs  Lab 02/02/21 0430 02/02/21 1947 02/03/21 0426 02/04/21 0730 02/05/21 0438 02/06/21 0455  WBC 7.4  --  8.1 6.5 6.1 5.0  HGB 8.1* 9.9* 9.2* 9.2* 9.3* 9.3*  HCT 27.0* 30.7* 27.5* 27.2* 25.6* 28.1*  MCV 99.4  --  92.0 94.1 96.2 95.6  PLT 109*  --  103* 97* 92* 66*   Cardiac Enzymes: No results for input(s): CKTOTAL, CKMB, CKMBINDEX, TROPONINI in the last 168 hours. BNP: BNP (last 3 results) No results for input(s): BNP in the last 8760 hours.  ProBNP (last 3 results) No results for input(s): PROBNP in the last 8760 hours.  CBG: No results for input(s): GLUCAP in the last 168 hours.     Signed:  Domenic Polite MD.  [facility] 02/06/2021, 4:37 PM

## 2021-02-06 NOTE — Progress Notes (Signed)
Prudencio Burly to be D/C'd  per MD order.  Discussed with the patient and all questions fully answered.  VSS, Skin clean, dry and intact without evidence of skin break down, no evidence of skin tears noted.  IV catheter discontinued intact. Site without signs and symptoms of complications. Dressing and pressure applied.  An After Visit Summary was printed and given to the patients legal guardian.  D/c education completed with patient/family including follow up instructions, medication list, d/c activities limitations if indicated, with other d/c instructions as indicated by MD - patient able to verbalize understanding, all questions fully answered.   Patient instructed to return to ED, call 911, or call MD for any changes in condition.   Patient to be escorted via [place], and D/C home via private auto.

## 2021-02-07 ENCOUNTER — Other Ambulatory Visit: Payer: Self-pay | Admitting: Oncology

## 2021-02-07 MED ORDER — QUETIAPINE FUMARATE 50 MG PO TABS: 50.0000 mg | ORAL_TABLET | Freq: Three times a day (TID) | ORAL | 0 refills | Status: AC

## 2021-02-07 NOTE — Progress Notes (Signed)
Re: [facility] called asking for something additional for this patient for anxiety/agitation.   Spoke with [employer] who recommends liberalizing Seroquel to TID dosing 25-50mg . New RX sent.   He was given Haldol while inpatient which is another option should we need it.   Attempted to call patient back but was unable to reach.   Faythe Casa, NP 02/07/2021 7:28 PM

## 2021-02-08 ENCOUNTER — Other Ambulatory Visit: Payer: Self-pay | Admitting: Hospice and Palliative Medicine

## 2021-02-08 ENCOUNTER — Telehealth: Payer: Self-pay | Admitting: *Deleted

## 2021-02-08 MED ORDER — MORPHINE SULFATE (CONCENTRATE) 10 MG /0.5 ML PO SOLN: 5.0000 mg | ORAL | 0 refills | Status: AC | PRN

## 2021-02-08 MED ORDER — LORAZEPAM 0.5 MG PO TABS: 0.5000 mg | ORAL_TABLET | ORAL | 0 refills | Status: AC | PRN

## 2021-02-08 NOTE — Telephone Encounter (Signed)
Hospice reports that the [facility] ordered for agitation has had the reverse effect on patient. Hospice Medical Director recommends Phenobarbital '30mg'$  2 times daily.

## 2021-02-08 NOTE — Telephone Encounter (Signed)
Josh - Will you collaborate with hospice [facility]. Thanks

## 2021-02-08 NOTE — Progress Notes (Signed)
I received a call from patient's hospice nurse, Vivien Rota. She says patient has been having anxiety/agitation unrelieved with Seroquel. Will send Rx for lorazepam and morphine elixir.

## 2021-02-18 DEATH — deceased

## 2022-10-18 IMAGING — CT CT HEAD W/O CM
3 series · 14 of 47 positions shown, 16 images · non-contrast
Comparison: MRI brain February 23, 2018 and head CT February 18, 2010

CLINICAL DATA: Altered mental status, weakness

EXAM:
CT HEAD WITHOUT CONTRAST
TECHNIQUE: Contiguous axial images were obtained from the base of the skull
through the vertex without intravenous contrast.

[Series 3: head wo · axial · 0.46mm/px · z∈[-159,-34]mm · 8 of 31 slices shown, 10 images]
[im 3/31  brain]
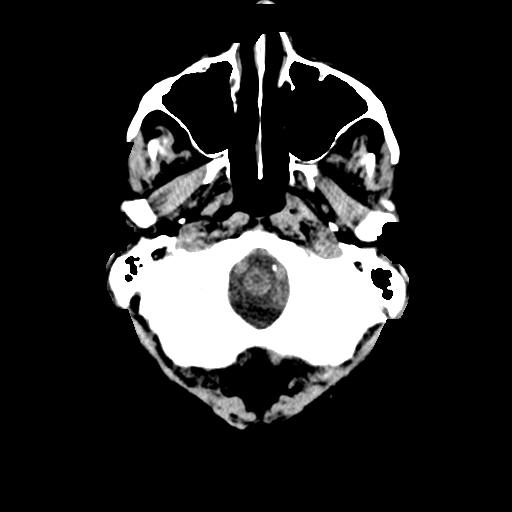
[im 3/31  bone]
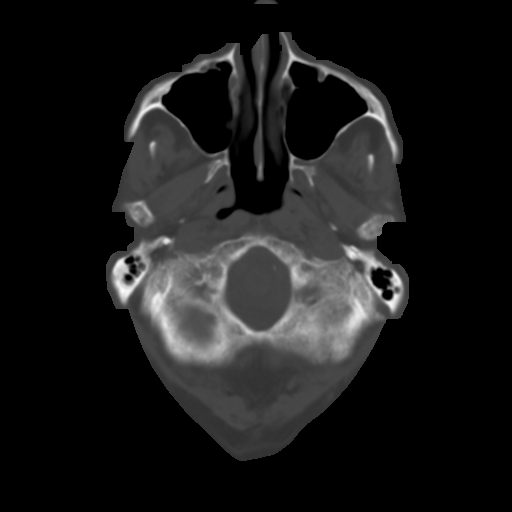
[im 7/31  brain]
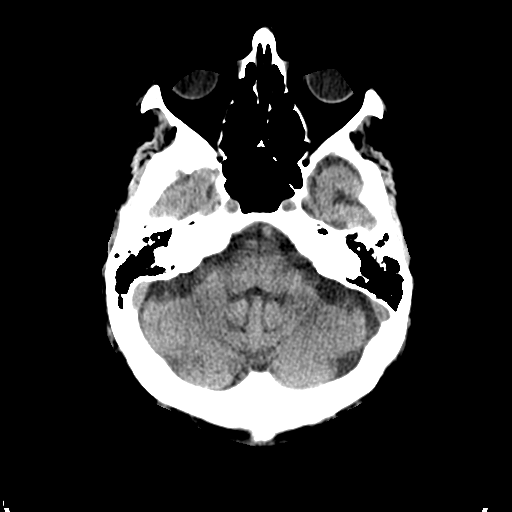
[im 10/31  brain]
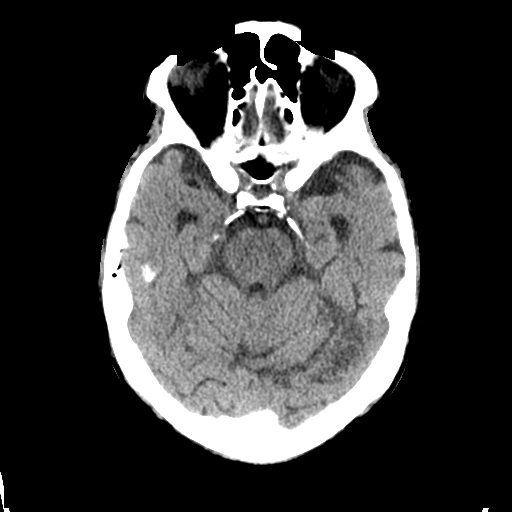
[im 14/31  brain]
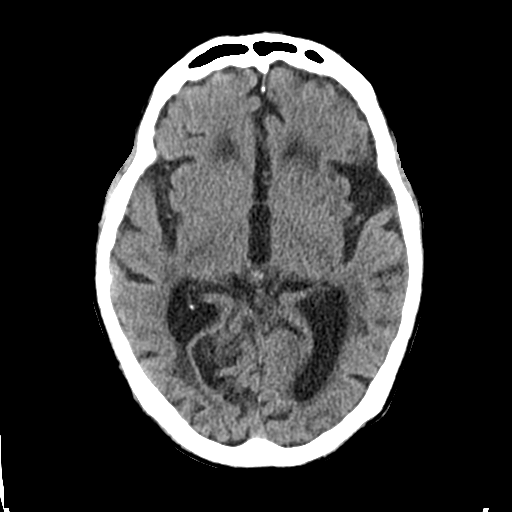
[im 17/31  brain]
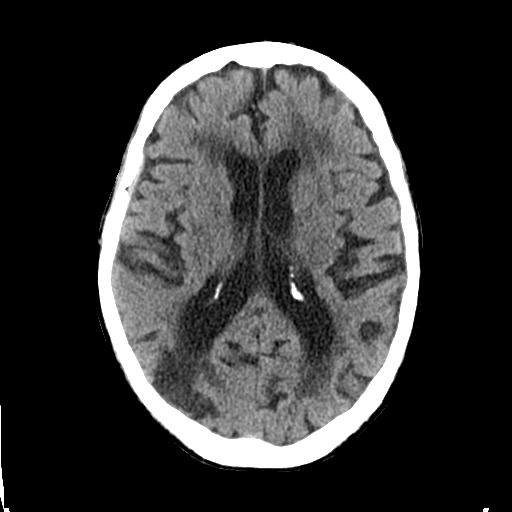
[im 17/31  bone]
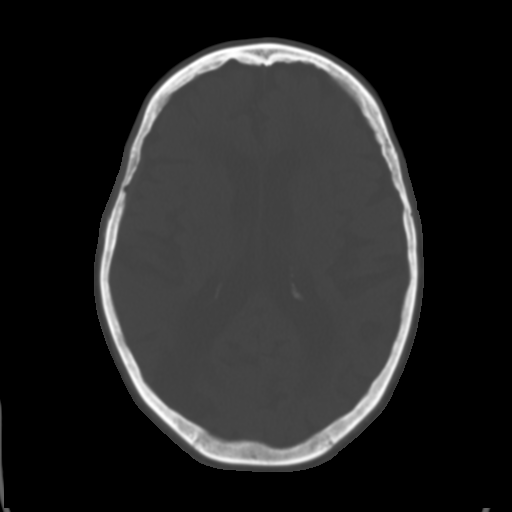
[im 21/31  brain]
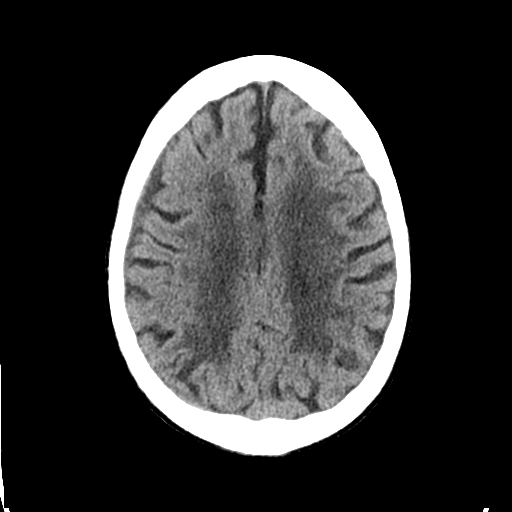
[im 24/31  brain]
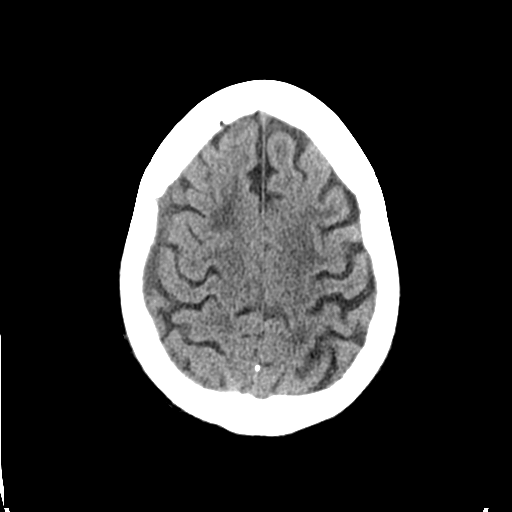
[im 28/31  brain]
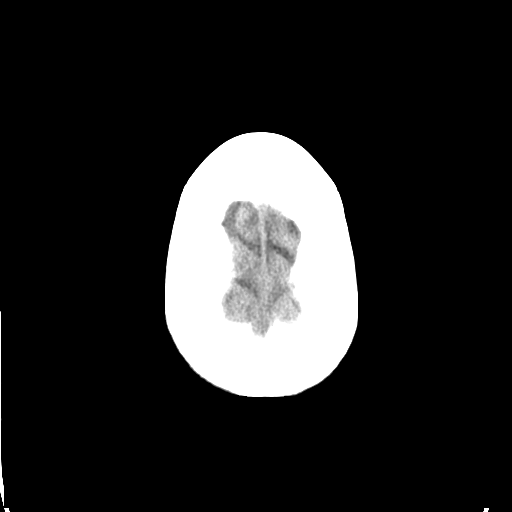

[Series 4: coronal soft tissue · coronal · 0.32mm/px · 3 of 65 slices shown]
[im 22/65  brain]
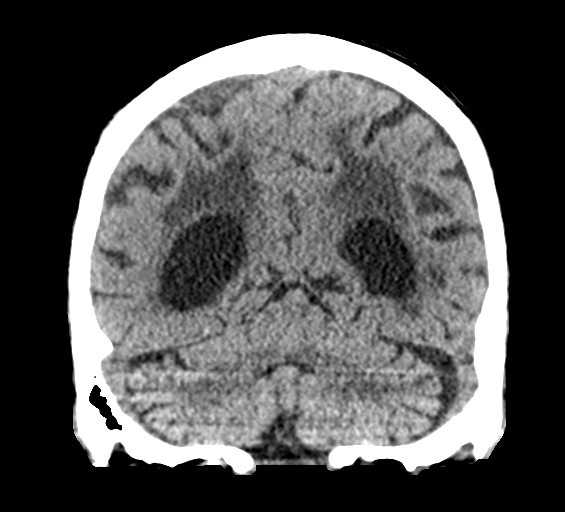
[im 29/65  brain]
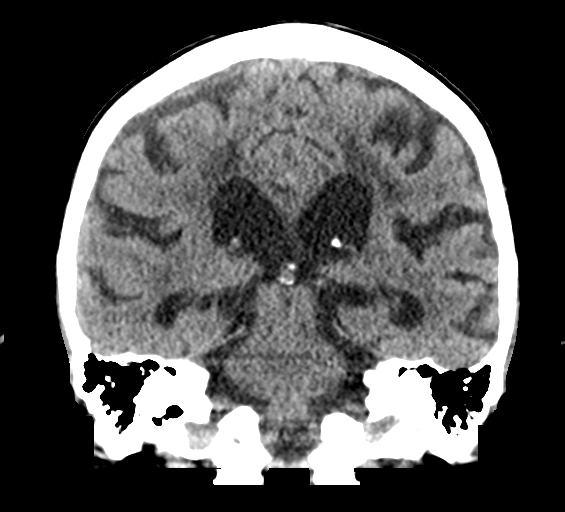
[im 36/65  brain]
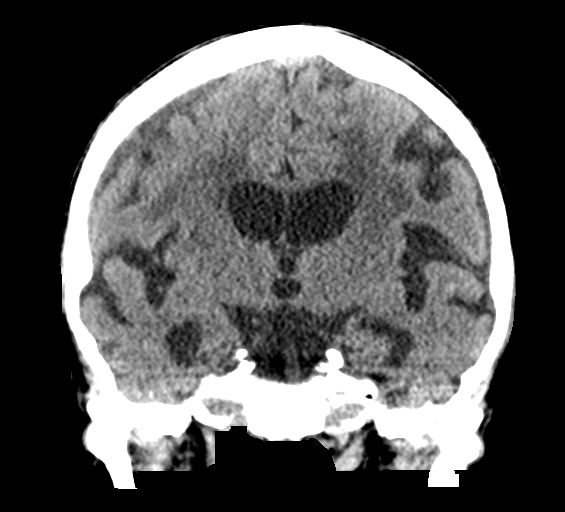

[Series 5: sagittal soft tissue · sagittal · 0.33mm/px · 3 of 55 slices shown]
[im 19/55  brain]
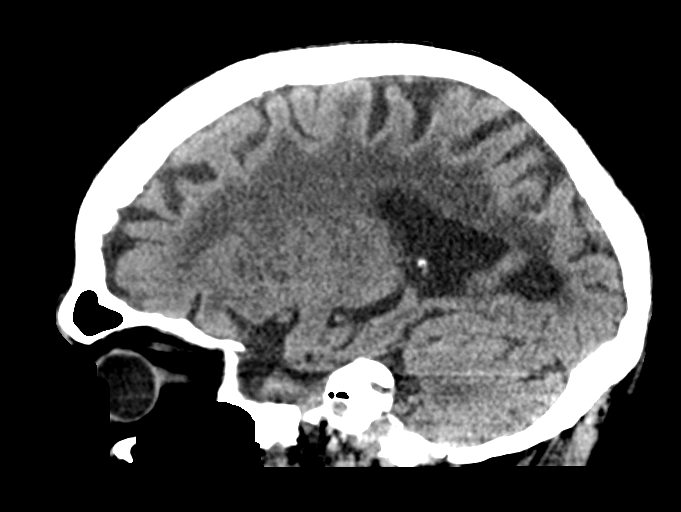
[im 28/55  brain]
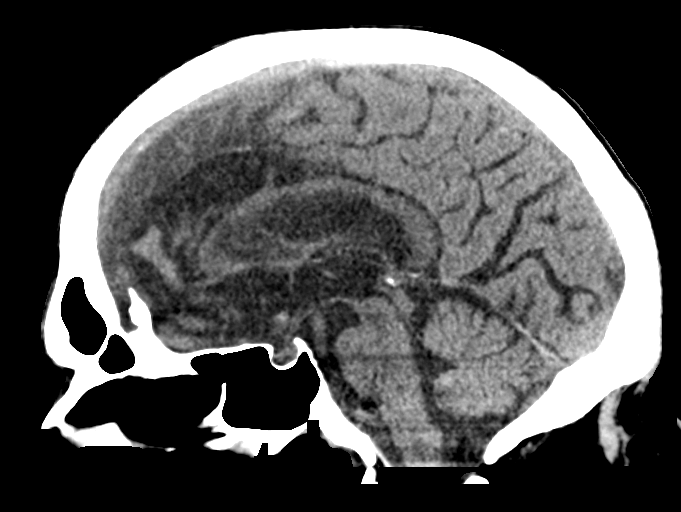
[im 37/55  brain]
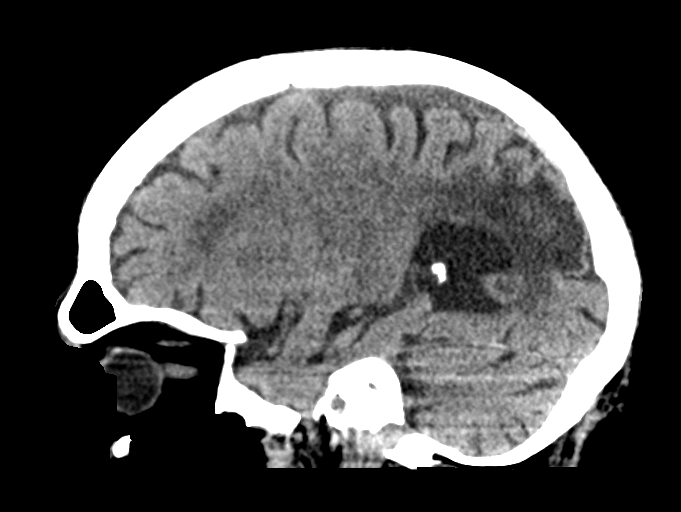

[14 of 47 positions shown; findings below may reference images not displayed]

FINDINGS: Brain: Mixed density right whole hemispheric collection measuring up
to 7 mm on coronal image 24. There is a hyperdense focus along the
inferior aspect of the collection also seen on image 24 of the
coronal sequence. Mild effacement of the parenchyma adjacent to the
collection without other findings of mass effect.

Age related global parenchymal volume loss with ex vacuo dilatation
of the ventricular system. Similar burden of moderate to severe
chronic ischemic small vessel white matter disease. Encephalomalacia
in the right posterior parietal lobe appears unchanged. Chronic
lacunar type infarcts in the basal ganglia and cerebellum.

No evidence of acute large vascular territory infarction,
parenchymal hemorrhage, hydrocephalus, or mass lesion.

Vascular: No hyperdense vessel. Atherosclerotic calcifications of
the internal carotid arteries at the skull base.

Skull: Normal. Negative for fracture or focal lesion.

Sinuses/Orbits: Visualized portions of the paranasal sinuses and
ethmoid air cells are predominantly clear. Prior bilateral lens
surgery.

Other: None
IMPRESSION: 1. Acute on chronic right whole hemispheric collection measuring up
to 7 mm. There is a hyperdense focus along the inferior aspect of
the collection which likely represents a small amount of acute blood
products. Minimal local mass effect.
2. Age related global parenchymal volume loss and moderate to severe
chronic ischemic small vessel white matter disease.
3. Encephalomalacia in the right posterior parietal lobe appears
unchanged. Chronic lacunar type infarcts in the basal ganglia and
cerebellum.

These results were called by telephone at the time of interpretation
on 01/29/2021 at [DATE] to provider TRIDIB BUVI , who verbally
acknowledged these results.
